# Patient Record
Sex: Female | Born: 1945 | Race: White | Hispanic: No | Smoking: Never smoker
Health system: Southern US, Community
[De-identification: ages and names within clinical notes are randomized; demographics above are authoritative.]

## PROBLEM LIST (undated history)

## (undated) DIAGNOSIS — M1990 Unspecified osteoarthritis, unspecified site: Secondary | ICD-10-CM

## (undated) DIAGNOSIS — I10 Essential (primary) hypertension: Secondary | ICD-10-CM

## (undated) DIAGNOSIS — E785 Hyperlipidemia, unspecified: Secondary | ICD-10-CM

## (undated) DIAGNOSIS — M7751 Other enthesopathy of right foot: Secondary | ICD-10-CM

## (undated) HISTORY — DX: Hyperlipidemia, unspecified: E78.5

## (undated) HISTORY — DX: Unspecified osteoarthritis, unspecified site: M19.90

## (undated) HISTORY — DX: Essential (primary) hypertension: I10

## (undated) HISTORY — DX: Other enthesopathy of right foot and ankle: M77.51

---

## 2006-08-03 ENCOUNTER — Ambulatory Visit: Payer: Self-pay | Admitting: Cardiovascular Disease

## 2006-08-03 LAB — CONVERTED CEMR LAB
Basophils Absolute: 0.1 10*3/uL (ref 0.0–0.1)
Creatinine, Ser: 0.9 mg/dL (ref 0.4–1.2)
Eosinophils Absolute: 0 10*3/uL (ref 0.0–0.6)
GFR calc non Af Amer: 67 mL/min
HCT: 46.1 % — ABNORMAL HIGH (ref 36.0–46.0)
Hemoglobin: 15.9 g/dL — ABNORMAL HIGH (ref 12.0–15.0)
MCHC: 34.4 g/dL (ref 30.0–36.0)
MCV: 86.8 fL (ref 78.0–100.0)
Monocytes Absolute: 0.5 10*3/uL (ref 0.2–0.7)
Neutrophils Relative %: 69.2 % (ref 43.0–77.0)
Potassium: 3.4 meq/L — ABNORMAL LOW (ref 3.5–5.1)
Sed Rate: 6 mm/hr (ref 0–25)
Sodium: 135 meq/L (ref 135–145)

## 2006-08-17 ENCOUNTER — Ambulatory Visit: Payer: Self-pay

## 2006-08-17 ENCOUNTER — Ambulatory Visit: Payer: Self-pay | Admitting: Cardiovascular Disease

## 2006-08-17 LAB — CONVERTED CEMR LAB
BUN: 17 mg/dL (ref 6–23)
CO2: 31 meq/L (ref 19–32)
Creatinine, Ser: 0.6 mg/dL (ref 0.4–1.2)
Ferritin: 80.2 ng/mL (ref 10.0–291.0)
Potassium: 3.8 meq/L (ref 3.5–5.1)

## 2011-08-18 DIAGNOSIS — E785 Hyperlipidemia, unspecified: Secondary | ICD-10-CM | POA: Diagnosis not present

## 2011-08-18 DIAGNOSIS — I10 Essential (primary) hypertension: Secondary | ICD-10-CM | POA: Diagnosis not present

## 2011-08-18 DIAGNOSIS — E559 Vitamin D deficiency, unspecified: Secondary | ICD-10-CM | POA: Diagnosis not present

## 2012-04-20 DIAGNOSIS — E785 Hyperlipidemia, unspecified: Secondary | ICD-10-CM | POA: Diagnosis not present

## 2012-04-20 DIAGNOSIS — I10 Essential (primary) hypertension: Secondary | ICD-10-CM | POA: Diagnosis not present

## 2012-10-08 ENCOUNTER — Other Ambulatory Visit: Payer: Self-pay | Admitting: Nurse Practitioner

## 2012-11-12 ENCOUNTER — Ambulatory Visit (INDEPENDENT_AMBULATORY_CARE_PROVIDER_SITE_OTHER): Payer: Medicare Other | Admitting: Nurse Practitioner

## 2012-11-12 VITALS — BP 113/66 | HR 73 | Temp 98.2°F | Ht 65.0 in | Wt 155.0 lb

## 2012-11-12 DIAGNOSIS — E785 Hyperlipidemia, unspecified: Secondary | ICD-10-CM | POA: Insufficient documentation

## 2012-11-12 DIAGNOSIS — I10 Essential (primary) hypertension: Secondary | ICD-10-CM | POA: Diagnosis not present

## 2012-11-12 MED ORDER — LISINOPRIL-HYDROCHLOROTHIAZIDE 20-12.5 MG PO TABS: 1.0000 | ORAL_TABLET | Freq: Every day | ORAL | Status: DC

## 2012-11-12 MED ORDER — ROSUVASTATIN CALCIUM 20 MG PO TABS: 20.0000 mg | ORAL_TABLET | Freq: Every day | ORAL | Status: DC

## 2012-11-12 NOTE — Progress Notes (Signed)
  Subjective:    Patient ID: Rebecca Wolfe, female    DOB: 05-26-1946, 67 y.o.   MRN: [id number]  Hyperlipidemia This is a chronic problem. The current episode started more than 1 year ago. The problem is uncontrolled. Recent lipid tests were reviewed and are high. Pertinent negatives include no chest pain or shortness of breath. Current antihyperlipidemic treatment includes statins. The current treatment provides significant improvement of lipids. Compliance problems include adherence to diet.  Risk factors for coronary artery disease include hypertension and post-menopausal.  Hypertension This is a chronic problem. The current episode started more than 1 year ago. The problem has been resolved since onset. The problem is controlled. Pertinent negatives include no blurred vision, chest pain, headaches, neck pain, orthopnea, palpitations, shortness of breath or sweats. There are no associated agents to hypertension. Risk factors for coronary artery disease include dyslipidemia, post-menopausal state and sedentary lifestyle. Past treatments include ACE inhibitors and diuretics. The current treatment provides significant improvement. There are no compliance problems.       Review of Systems  HENT: Negative for neck pain.   Eyes: Negative for blurred vision.  Respiratory: Negative for shortness of breath.   Cardiovascular: Negative for chest pain, palpitations and orthopnea.  Neurological: Negative for headaches.  All other systems reviewed and are negative.       Objective:   Physical Exam  Constitutional: Rebecca Wolfe is oriented to person, place, and time. Rebecca Wolfe appears well-developed and well-nourished.  HENT:  Nose: Nose normal.  Mouth/Throat: Oropharynx is clear and moist.  Eyes: EOM are normal.  Neck: Trachea normal, normal range of motion and full passive range of motion without pain. Neck supple. No JVD present. Carotid bruit is not present. No thyromegaly present.  Cardiovascular: Normal  rate, regular rhythm, normal heart sounds and intact distal pulses.  Exam reveals no gallop and no friction rub.   No murmur heard. Pulmonary/Chest: Effort normal and breath sounds normal.  Abdominal: Soft. Bowel sounds are normal. Rebecca Wolfe exhibits no distension and no mass. There is no tenderness.  Musculoskeletal: Normal range of motion.  Lymphadenopathy:    Rebecca Wolfe has no cervical adenopathy.  Neurological: Rebecca Wolfe is alert and oriented to person, place, and time. Rebecca Wolfe has normal reflexes.  Skin: Skin is warm and dry.  Psychiatric: Rebecca Wolfe has a normal mood and affect. Her behavior is normal. Judgment and thought content normal.   BP 113/66  Pulse 73  Temp(Src) 98.2 F (36.8 C) (Oral)  Ht 5\' 5"  (1.651 m)  Wt 155 lb (70.308 kg)  BMI 25.79 kg/m2        Assessment & Plan:  1. Hypertension Low na+ diet and exercise - lisinopril-hydrochlorothiazide (PRINZIDE,ZESTORETIC) 20-12.5 MG per tablet; Take 1 tablet by mouth daily.  Dispense: 30 tablet; Refill: 5 - COMPLETE METABOLIC PANEL WITH GFR  2. Hyperlipidemia Low fat diet an dexercise - rosuvastatin (CRESTOR) 20 MG tablet; Take 1 tablet (20 mg total) by mouth daily.  Dispense: 30 tablet; Refill: 5 - NMR Lipoprofile with Lipids  Mary-Margaret Daphine Deutscher, FNP

## 2012-11-12 NOTE — Patient Instructions (Signed)

## 2012-11-13 LAB — NMR LIPOPROFILE WITH LIPIDS
HDL Particle Number: 42.7 umol/L (ref >=30.5)
LDL Size: 20.3 nm — ABNORMAL LOW (ref >20.5)
Large HDL-P: 3 umol/L — ABNORMAL LOW (ref >=4.8)
Large VLDL-P: 3.3 nmol/L — ABNORMAL HIGH (ref <=2.7)
Small LDL Particle Number: 1193 nmol/L — ABNORMAL HIGH (ref <=527)

## 2012-11-13 LAB — COMPLETE METABOLIC PANEL WITH GFR
ALT: 25 U/L (ref 0–35)
Albumin: 4.7 g/dL (ref 3.5–5.2)
CO2: 26 mEq/L (ref 19–32)
Calcium: 10 mg/dL (ref 8.4–10.5)
Chloride: 99 mEq/L (ref 96–112)
Creat: 0.84 mg/dL (ref 0.50–1.10)
GFR, Est African American: 82 mL/min
Potassium: 4.7 mEq/L (ref 3.5–5.3)
Sodium: 136 mEq/L (ref 135–145)
Total Protein: 7.5 g/dL (ref 6.0–8.3)

## 2012-11-14 ENCOUNTER — Telehealth: Payer: Self-pay | Admitting: Nurse Practitioner

## 2012-11-14 DIAGNOSIS — I10 Essential (primary) hypertension: Secondary | ICD-10-CM

## 2012-11-14 MED ORDER — LISINOPRIL-HYDROCHLOROTHIAZIDE 20-12.5 MG PO TABS: 2.0000 | ORAL_TABLET | Freq: Every day | ORAL | Status: DC

## 2012-11-14 NOTE — Telephone Encounter (Signed)
WENT OVER LABS

## 2012-11-14 NOTE — Telephone Encounter (Signed)
rx corrected and sent to pharmacy

## 2012-11-14 NOTE — Telephone Encounter (Signed)
Pt aware that new rx was sent pharmacy

## 2012-11-14 NOTE — Telephone Encounter (Signed)
Please call pharmacy and find out exactly how has taken in the past

## 2012-11-14 NOTE — Telephone Encounter (Signed)
She has been on 2 a day per [place] at [facility]

## 2012-11-14 NOTE — Progress Notes (Signed)
PT AWARE OF LABS 

## 2012-12-05 ENCOUNTER — Encounter: Payer: Self-pay | Admitting: Pharmacist

## 2012-12-05 ENCOUNTER — Ambulatory Visit (INDEPENDENT_AMBULATORY_CARE_PROVIDER_SITE_OTHER): Payer: Medicare Other | Admitting: Pharmacist

## 2012-12-05 VITALS — BP 113/70 | HR 72 | Ht 65.0 in | Wt 150.0 lb

## 2012-12-05 DIAGNOSIS — E785 Hyperlipidemia, unspecified: Secondary | ICD-10-CM

## 2012-12-05 MED ORDER — EZETIMIBE-SIMVASTATIN 10-40 MG PO TABS: 1.0000 | ORAL_TABLET | Freq: Every day | ORAL | Status: DC

## 2012-12-05 NOTE — Progress Notes (Signed)
Lipid Clinic Consultation  Chief Complaint:   Chief Complaint  Patient presents with  . Hyperlipidemia     HPI:  First visit to clinical pharmacist for evaluation of hyperlipidemia.  She has been taking Crestor 20mg  daily but most recent LDL-P was still elevated.  Has taken lipitor, niaspan and zocor in past but she is not sure why stopped - possibly myalgias/flushing.  Family history significant for stroke - father at 69yo and paternal GF in 73's  Assessment: CHD/CHF Risk Equivalents:  none NCEP Risk Factors Present:  HTN, family history and age Primary Problem(s):  LDL or LDL-P elevated and TG elevated  Current NCEP Goals: LDL Goal < 100 HDL Goal >/= 45 Tg Goal < 147 Non-HDL Goal < 130  Secondary cause of hyperlipidemia present:  None  Low fat diet followed?  Yes - trying to limit fat intake and breads, pasta and rice.  No high sugar food Low carb diet followed?  Yes - trying to limit fat intake and breads, pasta and rice.  No high sugar food Exercise?  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        Component Value Date/Time   TRIG 159* 11/12/2012 1512   LDL-P = 1893 LDL-C = 119  Assessment:  Dyslipidemia  Recommendations: Changes in lipid medication(s):  Discontinue crestor and try vytorin 10/40mg  1 tablet daily in the evening  Recheck Lipid Panel:  6 weeks Other labs needed:  LFT's and direct LDL  Time spent counseling patient:  30 minutes    PharmD:  Henrene Pastor, PHARMD

## 2013-01-16 ENCOUNTER — Other Ambulatory Visit (INDEPENDENT_AMBULATORY_CARE_PROVIDER_SITE_OTHER): Payer: Medicare Other

## 2013-01-16 DIAGNOSIS — E785 Hyperlipidemia, unspecified: Secondary | ICD-10-CM

## 2013-01-16 LAB — LIPID PANEL
Cholesterol: 114 mg/dL (ref 0–200)
HDL: 42 mg/dL (ref >39)
Total CHOL/HDL Ratio: 2.7 Ratio
Triglycerides: 110 mg/dL (ref <150)
VLDL: 22 mg/dL (ref 0–40)

## 2013-01-16 LAB — HEPATIC FUNCTION PANEL
AST: 21 U/L (ref 0–37)
Bilirubin, Direct: 0.1 mg/dL (ref 0.0–0.3)
Total Bilirubin: 0.5 mg/dL (ref 0.3–1.2)

## 2013-01-16 NOTE — Progress Notes (Signed)
Pt came in for labs only 

## 2013-01-17 ENCOUNTER — Telehealth: Payer: Self-pay | Admitting: Pharmacist

## 2013-01-17 NOTE — Telephone Encounter (Signed)
Patient is tolerating Vytorin well.  LFTs were WNL.  Continue Vytorin at current dose.

## 2013-01-24 ENCOUNTER — Encounter: Payer: Self-pay | Admitting: Family Medicine

## 2013-04-11 ENCOUNTER — Other Ambulatory Visit: Payer: Self-pay

## 2013-04-24 ENCOUNTER — Ambulatory Visit (INDEPENDENT_AMBULATORY_CARE_PROVIDER_SITE_OTHER): Payer: Medicare Other | Admitting: Nurse Practitioner

## 2013-04-24 ENCOUNTER — Encounter: Payer: Self-pay | Admitting: Nurse Practitioner

## 2013-04-24 VITALS — BP 117/58 | HR 68 | Temp 99.7°F | Ht 66.0 in | Wt 149.0 lb

## 2013-04-24 DIAGNOSIS — E785 Hyperlipidemia, unspecified: Secondary | ICD-10-CM | POA: Diagnosis not present

## 2013-04-24 DIAGNOSIS — I10 Essential (primary) hypertension: Secondary | ICD-10-CM | POA: Diagnosis not present

## 2013-04-24 DIAGNOSIS — Z23 Encounter for immunization: Secondary | ICD-10-CM

## 2013-04-24 MED ORDER — LISINOPRIL-HYDROCHLOROTHIAZIDE 20-12.5 MG PO TABS: 2.0000 | ORAL_TABLET | Freq: Every day | ORAL | Status: DC

## 2013-04-24 MED ORDER — EZETIMIBE-SIMVASTATIN 10-40 MG PO TABS: 1.0000 | ORAL_TABLET | Freq: Every day | ORAL | Status: DC

## 2013-04-24 NOTE — Patient Instructions (Signed)
Hypertriglyceridemia  Diet for High blood levels of Triglycerides Most fats in food are triglycerides. Triglycerides in your blood are stored as fat in your body. High levels of triglycerides in your blood may put you at a greater risk for heart disease and stroke.  Normal triglyceride levels are less than 150 mg/dL. Borderline high levels are 150-199 mg/dl. High levels are 200 - 499 mg/dL, and very high triglyceride levels are greater than 500 mg/dL. The decision to treat high triglycerides is generally based on the level. For people with borderline or high triglyceride levels, treatment includes weight loss and exercise. Drugs are recommended for people with very high triglyceride levels. Many people who need treatment for high triglyceride levels have metabolic syndrome. This syndrome is a collection of disorders that often include: insulin resistance, high blood pressure, blood clotting problems, high cholesterol and triglycerides. TESTING PROCEDURE FOR TRIGLYCERIDES  You should not eat 4 hours before getting your triglycerides measured. The normal range of triglycerides is between 10 and 250 milligrams per deciliter (mg/dl). Some people may have extreme levels (1000 or above), but your triglyceride level may be too high if it is above 150 mg/dl, depending on what other risk factors you have for heart disease.  People with high blood triglycerides may also have high blood cholesterol levels. If you have high blood cholesterol as well as high blood triglycerides, your risk for heart disease is probably greater than if you only had high triglycerides. High blood cholesterol is one of the main risk factors for heart disease. CHANGING YOUR DIET  Your weight can affect your blood triglyceride level. If you are more than 20% above your ideal body weight, you may be able to lower your blood triglycerides by losing weight. Eating less and exercising regularly is the best way to combat this. Fat provides more  calories than any other food. The best way to lose weight is to eat less fat. Only 30% of your total calories should come from fat. Less than 7% of your diet should come from saturated fat. A diet low in fat and saturated fat is the same as a diet to decrease blood cholesterol. By eating a diet lower in fat, you may lose weight, lower your blood cholesterol, and lower your blood triglyceride level.  Eating a diet low in fat, especially saturated fat, may also help you lower your blood triglyceride level. Ask your dietitian to help you figure how much fat you can eat based on the number of calories your caregiver has prescribed for you.  Exercise, in addition to helping with weight loss may also help lower triglyceride levels.   Alcohol can increase blood triglycerides. You may need to stop drinking alcoholic beverages.  Too much carbohydrate in your diet may also increase your blood triglycerides. Some complex carbohydrates are necessary in your diet. These may include bread, rice, potatoes, other starchy vegetables and cereals.  Reduce "simple" carbohydrates. These may include pure sugars, candy, honey, and jelly without losing other nutrients. If you have the kind of high blood triglycerides that is affected by the amount of carbohydrates in your diet, you will need to eat less sugar and less high-sugar foods. Your caregiver can help you with this.  Adding 2-4 grams of fish oil (EPA+ DHA) may also help lower triglycerides. Speak with your caregiver before adding any supplements to your regimen. Following the Diet  Maintain your ideal weight. Your caregivers can help you with a diet. Generally, eating less food and getting more   exercise will help you lose weight. Joining a weight control group may also help. Ask your caregivers for a good weight control group in your area.  Eat low-fat foods instead of high-fat foods. This can help you lose weight too.  These foods are lower in fat. Eat MORE of these:    Dried beans, peas, and lentils.  Egg whites.  Low-fat cottage cheese.  Fish.  Lean cuts of meat, such as round, sirloin, rump, and flank (cut extra fat off meat you fix).  Whole grain breads, cereals and pasta.  Skim and nonfat dry milk.  Low-fat yogurt.  Poultry without the skin.  Cheese made with skim or part-skim milk, such as mozzarella, parmesan, farmers', ricotta, or pot cheese. These are higher fat foods. Eat LESS of these:   Whole milk and foods made from whole milk, such as American, blue, cheddar, monterey jack, and swiss cheese  High-fat meats, such as luncheon meats, sausages, knockwurst, bratwurst, hot dogs, ribs, corned beef, ground pork, and regular ground beef.  Fried foods. Limit saturated fats in your diet. Substituting unsaturated fat for saturated fat may decrease your blood triglyceride level. You will need to read package labels to know which products contain saturated fats.  These foods are high in saturated fat. Eat LESS of these:   Fried pork skins.  Whole milk.  Skin and fat from poultry.  Palm oil.  Butter.  Shortening.  Cream cheese.  Bacon.  Margarines and baked goods made from listed oils.  Vegetable shortenings.  Chitterlings.  Fat from meats.  Coconut oil.  Palm kernel oil.  Lard.  Cream.  Sour cream.  Fatback.  Coffee whiteners and non-dairy creamers made with these oils.  Cheese made from whole milk. Use unsaturated fats (both polyunsaturated and monounsaturated) moderately. Remember, even though unsaturated fats are better than saturated fats; you still want a diet low in total fat.  These foods are high in unsaturated fat:   Canola oil.  Sunflower oil.  Mayonnaise.  Almonds.  Peanuts.  Pine nuts.  Margarines made with these oils.  Safflower oil.  Olive oil.  Avocados.  Cashews.  Peanut butter.  Sunflower seeds.  Soybean oil.  Peanut  oil.  Olives.  Pecans.  Walnuts.  Pumpkin seeds. Avoid sugar and other high-sugar foods. This will decrease carbohydrates without decreasing other nutrients. Sugar in your food goes rapidly to your blood. When there is excess sugar in your blood, your liver may use it to make more triglycerides. Sugar also contains calories without other important nutrients.  Eat LESS of these:   Sugar, brown sugar, powdered sugar, jam, jelly, preserves, honey, syrup, molasses, pies, candy, cakes, cookies, frosting, pastries, colas, soft drinks, punches, fruit drinks, and regular gelatin.  Avoid alcohol. Alcohol, even more than sugar, may increase blood triglycerides. In addition, alcohol is high in calories and low in nutrients. Ask for sparkling water, or a diet soft drink instead of an alcoholic beverage. Suggestions for planning and preparing meals   Bake, broil, grill or roast meats instead of frying.  Remove fat from meats and skin from poultry before cooking.  Add spices, herbs, lemon juice or vinegar to vegetables instead of salt, rich sauces or gravies.  Use a non-stick skillet without fat or use no-stick sprays.  Cool and refrigerate stews and broth. Then remove the hardened fat floating on the surface before serving.  Refrigerate meat drippings and skim off fat to make low-fat gravies.  Serve more fish.  Use less butter,   margarine and other high-fat spreads on bread or vegetables.  Use skim or reconstituted non-fat dry milk for cooking.  Cook with low-fat cheeses.  Substitute low-fat yogurt or cottage cheese for all or part of the sour cream in recipes for sauces, dips or congealed salads.  Use half yogurt/half mayonnaise in salad recipes.  Substitute evaporated skim milk for cream. Evaporated skim milk or reconstituted non-fat dry milk can be whipped and substituted for whipped cream in certain recipes.  Choose fresh fruits for dessert instead of high-fat foods such as pies or  cakes. Fruits are naturally low in fat. When Dining Out   Order low-fat appetizers such as fruit or vegetable juice, pasta with vegetables or tomato sauce.  Select clear, rather than cream soups.  Ask that dressings and gravies be served on the side. Then use less of them.  Order foods that are baked, broiled, poached, steamed, stir-fried, or roasted.  Ask for margarine instead of butter, and use only a small amount.  Drink sparkling water, unsweetened tea or coffee, or diet soft drinks instead of alcohol or other sweet beverages. QUESTIONS AND ANSWERS ABOUT OTHER FATS IN THE BLOOD: SATURATED FAT, TRANS FAT, AND CHOLESTEROL What is trans fat? Trans fat is a type of fat that is formed when vegetable oil is hardened through a process called hydrogenation. This process helps makes foods more solid, gives them shape, and prolongs their shelf life. Trans fats are also called hydrogenated or partially hydrogenated oils.  What do saturated fat, trans fat, and cholesterol in foods have to do with heart disease? Saturated fat, trans fat, and cholesterol in the diet all raise the level of LDL "bad" cholesterol in the blood. The higher the LDL cholesterol, the greater the risk for coronary heart disease (CHD). Saturated fat and trans fat raise LDL similarly.  What foods contain saturated fat, trans fat, and cholesterol? High amounts of saturated fat are found in animal products, such as fatty cuts of meat, chicken skin, and full-fat dairy products like butter, whole milk, cream, and cheese, and in tropical vegetable oils such as palm, palm kernel, and coconut oil. Trans fat is found in some of the same foods as saturated fat, such as vegetable shortening, some margarines (especially hard or stick margarine), crackers, cookies, baked goods, fried foods, salad dressings, and other processed foods made with partially hydrogenated vegetable oils. Small amounts of trans fat also occur naturally in some animal  products, such as milk products, beef, and lamb. Foods high in cholesterol include liver, other organ meats, egg yolks, shrimp, and full-fat dairy products. How can I use the new food label to make heart-healthy food choices? Check the Nutrition Facts panel of the food label. Choose foods lower in saturated fat, trans fat, and cholesterol. For saturated fat and cholesterol, you can also use the Percent Daily Value (%DV): 5% DV or less is low, and 20% DV or more is high. (There is no %DV for trans fat.) Use the Nutrition Facts panel to choose foods low in saturated fat and cholesterol, and if the trans fat is not listed, read the ingredients and limit products that list shortening or hydrogenated or partially hydrogenated vegetable oil, which tend to be high in trans fat. POINTS TO REMEMBER:   Discuss your risk for heart disease with your caregivers, and take steps to reduce risk factors.  Change your diet. Choose foods that are low in saturated fat, trans fat, and cholesterol.  Add exercise to your daily routine if   it is not already being done. Participate in physical activity of moderate intensity, like brisk walking, for at least 30 minutes on most, and preferably all days of the week. No time? Break the 30 minutes into three, 10-minute segments during the day.  Stop smoking. If you do smoke, contact your caregiver to discuss ways in which they can help you quit.  Do not use street drugs.  Maintain a normal weight.  Maintain a healthy blood pressure.  Keep up with your blood work for checking the fats in your blood as directed by your caregiver. Document Released: 03/10/2004 Document Revised: 11/22/2011 Document Reviewed: 10/06/2008 [employer] Patient Information 2014 [employer].  

## 2013-04-24 NOTE — Progress Notes (Signed)
Subjective:    Patient ID: Rebecca Wolfe, female    DOB: 03-Dec-1945, 67 y.o.   MRN: [id number]  Hyperlipidemia This is a chronic problem. The current episode started more than 1 year ago. The problem is uncontrolled. Recent lipid tests were reviewed and are high. Pertinent negatives include no chest pain or shortness of breath. Current antihyperlipidemic treatment includes statins. The current treatment provides significant improvement of lipids. Compliance problems include adherence to diet.  Risk factors for coronary artery disease include hypertension and post-menopausal.  Hypertension This is a chronic problem. The current episode started more than 1 year ago. The problem has been resolved since onset. The problem is controlled. Pertinent negatives include no blurred vision, chest pain, headaches, neck pain, orthopnea, palpitations, shortness of breath or sweats. There are no associated agents to hypertension. Risk factors for coronary artery disease include dyslipidemia, post-menopausal state and sedentary lifestyle. Past treatments include ACE inhibitors and diuretics. The current treatment provides significant improvement. There are no compliance problems.       Review of Systems  Eyes: Negative for blurred vision.  Respiratory: Negative for shortness of breath.   Cardiovascular: Negative for chest pain, palpitations and orthopnea.  Musculoskeletal: Negative for neck pain.  Neurological: Negative for headaches.  All other systems reviewed and are negative.       Objective:   Physical Exam  Constitutional: She is oriented to person, place, and time. She appears well-developed and well-nourished.  HENT:  Nose: Nose normal.  Mouth/Throat: Oropharynx is clear and moist.  Eyes: EOM are normal.  Neck: Trachea normal, normal range of motion and full passive range of motion without pain. Neck supple. No JVD present. Carotid bruit is not present. No thyromegaly present.  Cardiovascular:  Normal rate, regular rhythm, normal heart sounds and intact distal pulses.  Exam reveals no gallop and no friction rub.   No murmur heard. Pulmonary/Chest: Effort normal and breath sounds normal.  Abdominal: Soft. Bowel sounds are normal. She exhibits no distension and no mass. There is no tenderness.  Musculoskeletal: Normal range of motion.  Lymphadenopathy:    She has no cervical adenopathy.  Neurological: She is alert and oriented to person, place, and time. She has normal reflexes.  Skin: Skin is warm and dry.  Psychiatric: She has a normal mood and affect. Her behavior is normal. Judgment and thought content normal.   BP 117/58  Pulse 68  Temp(Src) 99.7 F (37.6 C) (Oral)  Ht 5\' 6"  (1.676 m)  Wt 149 lb (67.586 kg)  BMI 24.06 kg/m2        Assessment & Plan:   1. Need for prophylactic vaccination and inoculation against influenza   2. Hypertension   3. Hyperlipidemia    Orders Placed This Encounter  Procedures  . CMP14+EGFR  . NMR, lipoprofile   Meds ordered this encounter  Medications  . lisinopril-hydrochlorothiazide (PRINZIDE,ZESTORETIC) 20-12.5 MG per tablet    Sig: Take 2 tablets by mouth daily.    Dispense:  60 tablet    Refill:  5    Order Specific Question:  Supervising Provider    Answer:  Ernestina Penna [[id number]]  . ezetimibe-simvastatin (VYTORIN) 10-40 MG per tablet    Sig: Take 1 tablet by mouth at bedtime.    Dispense:  49 tablet    Refill:  0    Order Specific Question:  Supervising Provider    Answer:  Deborra Medina    Continue all meds Labs pending Diet and exercise encouraged  Health maintenance reviewed Follow up in 3 months   Mary-Margaret Daphine Deutscher, FNP

## 2013-04-25 ENCOUNTER — Telehealth: Payer: Self-pay | Admitting: Nurse Practitioner

## 2013-04-25 ENCOUNTER — Other Ambulatory Visit: Payer: Self-pay | Admitting: Nurse Practitioner

## 2013-04-25 MED ORDER — ATORVASTATIN CALCIUM 40 MG PO TABS: 40.0000 mg | ORAL_TABLET | Freq: Every day | ORAL | Status: DC

## 2013-04-25 NOTE — Telephone Encounter (Signed)
Patient on vytorin and insurance doesn't want to pay for it- would she like to do zocor by itself which will not work as well or will she do lipitor which will work better.

## 2013-04-25 NOTE — Telephone Encounter (Signed)
Patient is aware and will agree to take

## 2013-04-25 NOTE — Telephone Encounter (Signed)
lipitor sent to pharmacy

## 2013-04-26 LAB — NMR, LIPOPROFILE
LDL Particle Number: 1201 nmol/L — ABNORMAL HIGH (ref <1000)
LDL Size: 20.5 nm — ABNORMAL LOW (ref >20.5)
LDLC SERPL CALC-MCNC: 52 mg/dL (ref <100)
LP-IR Score: 63 — ABNORMAL HIGH (ref <=45)

## 2013-04-26 LAB — CMP14+EGFR
AST: 18 IU/L (ref 0–40)
Alkaline Phosphatase: 52 IU/L (ref 39–117)
BUN/Creatinine Ratio: 23 (ref 11–26)
BUN: 20 mg/dL (ref 8–27)
CO2: 26 mmol/L (ref 18–29)
Chloride: 98 mmol/L (ref 97–108)
Potassium: 4.9 mmol/L (ref 3.5–5.2)
Sodium: 141 mmol/L (ref 134–144)
Total Bilirubin: 0.3 mg/dL (ref 0.0–1.2)

## 2013-07-16 ENCOUNTER — Telehealth: Payer: Self-pay | Admitting: Nurse Practitioner

## 2013-07-26 ENCOUNTER — Ambulatory Visit: Payer: Medicare Other | Admitting: Nurse Practitioner

## 2013-08-14 ENCOUNTER — Encounter: Payer: Self-pay | Admitting: Nurse Practitioner

## 2013-08-14 ENCOUNTER — Ambulatory Visit (INDEPENDENT_AMBULATORY_CARE_PROVIDER_SITE_OTHER): Payer: Medicare Other | Admitting: Nurse Practitioner

## 2013-08-14 VITALS — BP 112/65 | HR 76 | Temp 97.4°F | Ht 66.0 in | Wt 149.4 lb

## 2013-08-14 DIAGNOSIS — E785 Hyperlipidemia, unspecified: Secondary | ICD-10-CM

## 2013-08-14 DIAGNOSIS — R5383 Other fatigue: Secondary | ICD-10-CM

## 2013-08-14 DIAGNOSIS — I10 Essential (primary) hypertension: Secondary | ICD-10-CM | POA: Diagnosis not present

## 2013-08-14 DIAGNOSIS — R5381 Other malaise: Secondary | ICD-10-CM

## 2013-08-14 MED ORDER — ATORVASTATIN CALCIUM 40 MG PO TABS
40.0000 mg | ORAL_TABLET | Freq: Every day | ORAL | Status: DC
Start: 2013-08-14 — End: 2014-04-25

## 2013-08-14 MED ORDER — CITALOPRAM HYDROBROMIDE 20 MG PO TABS: 20.0000 mg | ORAL_TABLET | Freq: Every day | ORAL | Status: DC

## 2013-08-14 MED ORDER — LISINOPRIL-HYDROCHLOROTHIAZIDE 20-12.5 MG PO TABS: 2.0000 | ORAL_TABLET | Freq: Every day | ORAL | Status: DC

## 2013-08-14 NOTE — Patient Instructions (Signed)

## 2013-08-14 NOTE — Progress Notes (Signed)
Subjective:    Patient ID: Rebecca Wolfe, female    DOB: 1946/02/04, 68 y.o.   MRN: [id number]  Patient in today for follow up of chronic medical problems- SHe is doing well- Her only complaint is anxiety- SHe has been under a lot of stress- needs something to calm her down.  Hyperlipidemia This is a chronic problem. The current episode started more than 1 year ago. The problem is uncontrolled. Recent lipid tests were reviewed and are high. Pertinent negatives include no chest pain or shortness of breath. Current antihyperlipidemic treatment includes statins. The current treatment provides significant improvement of lipids. Compliance problems include adherence to diet.  Risk factors for coronary artery disease include hypertension and post-menopausal.  Hypertension This is a chronic problem. The current episode started more than 1 year ago. The problem has been resolved since onset. The problem is controlled. Pertinent negatives include no blurred vision, chest pain, headaches, neck pain, orthopnea, palpitations, shortness of breath or sweats. There are no associated agents to hypertension. Risk factors for coronary artery disease include dyslipidemia, post-menopausal state and sedentary lifestyle. Past treatments include ACE inhibitors and diuretics. The current treatment provides significant improvement. There are no compliance problems.       Review of Systems  Eyes: Negative for blurred vision.  Respiratory: Negative for shortness of breath.   Cardiovascular: Negative for chest pain, palpitations and orthopnea.  Musculoskeletal: Negative for neck pain.  Neurological: Negative for headaches.  All other systems reviewed and are negative.       Objective:   Physical Exam  Constitutional: She is oriented to person, place, and time. She appears well-developed and well-nourished.  HENT:  Nose: Nose normal.  Mouth/Throat: Oropharynx is clear and moist.  Eyes: EOM are normal.  Neck:  Trachea normal, normal range of motion and full passive range of motion without pain. Neck supple. No JVD present. Carotid bruit is not present. No thyromegaly present.  Cardiovascular: Normal rate, regular rhythm, normal heart sounds and intact distal pulses.  Exam reveals no gallop and no friction rub.   No murmur heard. Pulmonary/Chest: Effort normal and breath sounds normal.  Abdominal: Soft. Bowel sounds are normal. She exhibits no distension and no mass. There is no tenderness.  Musculoskeletal: Normal range of motion.  Lymphadenopathy:    She has no cervical adenopathy.  Neurological: She is alert and oriented to person, place, and time. She has normal reflexes.  Skin: Skin is warm and dry.  Psychiatric: She has a normal mood and affect. Her behavior is normal. Judgment and thought content normal.   BP 112/65  Pulse 76  Temp(Src) 97.4 F (36.3 C) (Oral)  Ht 5' 6" (1.676 m)  Wt 149 lb 6.4 oz (67.767 kg)  BMI 24.13 kg/m2        Assessment & Plan:   1. Hypertension   2. Hyperlipidemia   3. Other malaise and fatigue    Orders Placed This Encounter  Procedures  . CMP14+EGFR  . NMR, lipoprofile  . Thyroid Panel With TSH   Meds ordered this encounter  Medications  . atorvastatin (LIPITOR) 40 MG tablet    Sig: Take 1 tablet (40 mg total) by mouth daily.    Dispense:  90 tablet    Refill:  1    Order Specific Question:  Supervising Provider    Answer:  Chipper Herb [[id number]]  . lisinopril-hydrochlorothiazide (PRINZIDE,ZESTORETIC) 20-12.5 MG per tablet    Sig: Take 2 tablets by mouth daily.    Dispense:  90 tablet    Refill:  1    Order Specific Question:  Supervising Provider    Answer:  Chipper Herb [[id number]]  . citalopram (CELEXA) 20 MG tablet    Sig: Take 1 tablet (20 mg total) by mouth daily.    Dispense:  30 tablet    Refill:  3    Order Specific Question:  Supervising Provider    Answer:  Chipper Herb [[id number]]    Labs pending Health maintenance  reviewed Diet and exercise encouraged Continue all meds Follow up  In 3 months    [employer], FNP

## 2013-08-16 LAB — THYROID PANEL WITH TSH
Free Thyroxine Index: 2.3 (ref 1.2–4.9)
T3 Uptake Ratio: 23 % — ABNORMAL LOW (ref 24–39)
T4, Total: 10.1 ug/dL (ref 4.5–12.0)
TSH: 1.51 u[IU]/mL (ref 0.450–4.500)

## 2013-08-16 LAB — NMR, LIPOPROFILE
Cholesterol: 162 mg/dL (ref <200)
HDL CHOLESTEROL BY NMR: 57 mg/dL (ref >=40)
HDL Particle Number: 42.2 umol/L (ref >=30.5)
LDL Particle Number: 1257 nmol/L — ABNORMAL HIGH (ref <1000)
LDL Size: 20.3 nm — ABNORMAL LOW (ref >20.5)
LDLC SERPL CALC-MCNC: 81 mg/dL (ref <100)
LP-IR Score: 57 — ABNORMAL HIGH (ref <=45)
SMALL LDL PARTICLE NUMBER: 773 nmol/L — AB (ref <=527)
Triglycerides by NMR: 118 mg/dL (ref <150)

## 2013-08-16 LAB — CMP14+EGFR
ALBUMIN: 5 g/dL — AB (ref 3.6–4.8)
ALK PHOS: 56 IU/L (ref 39–117)
ALT: 29 IU/L (ref 0–32)
AST: 22 IU/L (ref 0–40)
Albumin/Globulin Ratio: 2.3 (ref 1.1–2.5)
BILIRUBIN TOTAL: 0.4 mg/dL (ref 0.0–1.2)
BUN / CREAT RATIO: 18 (ref 11–26)
BUN: 17 mg/dL (ref 8–27)
CHLORIDE: 98 mmol/L (ref 97–108)
CO2: 21 mmol/L (ref 18–29)
Calcium: 9.8 mg/dL (ref 8.7–10.3)
Creatinine, Ser: 0.93 mg/dL (ref 0.57–1.00)
GFR calc non Af Amer: 63 mL/min/{1.73_m2} (ref >59)
GFR, EST AFRICAN AMERICAN: 73 mL/min/{1.73_m2} (ref >59)
Globulin, Total: 2.2 g/dL (ref 1.5–4.5)
Glucose: 85 mg/dL (ref 65–99)
Potassium: 4.8 mmol/L (ref 3.5–5.2)
SODIUM: 137 mmol/L (ref 134–144)
Total Protein: 7.2 g/dL (ref 6.0–8.5)

## 2013-12-02 ENCOUNTER — Other Ambulatory Visit: Payer: Self-pay | Admitting: *Deleted

## 2013-12-02 MED ORDER — CITALOPRAM HYDROBROMIDE 20 MG PO TABS: 20.0000 mg | ORAL_TABLET | Freq: Every day | ORAL | Status: DC

## 2014-02-24 ENCOUNTER — Other Ambulatory Visit: Payer: Self-pay | Admitting: Nurse Practitioner

## 2014-02-25 NOTE — Telephone Encounter (Signed)
no more refills without being seen  

## 2014-02-25 NOTE — Telephone Encounter (Signed)
Last ov 3/15

## 2014-04-25 ENCOUNTER — Other Ambulatory Visit: Payer: Self-pay | Admitting: *Deleted

## 2014-04-25 MED ORDER — LISINOPRIL-HYDROCHLOROTHIAZIDE 20-12.5 MG PO TABS: 2.0000 | ORAL_TABLET | Freq: Every day | ORAL | Status: DC

## 2014-04-25 MED ORDER — ATORVASTATIN CALCIUM 40 MG PO TABS: 40.0000 mg | ORAL_TABLET | Freq: Every day | ORAL | Status: DC

## 2014-04-25 NOTE — Telephone Encounter (Signed)
Needs refill on lipitor and bp med until she sees mmm on 06/16/2014

## 2014-06-16 ENCOUNTER — Ambulatory Visit: Payer: Medicare Other | Admitting: Nurse Practitioner

## 2014-06-30 ENCOUNTER — Ambulatory Visit (INDEPENDENT_AMBULATORY_CARE_PROVIDER_SITE_OTHER): Payer: Medicare Other | Admitting: Nurse Practitioner

## 2014-06-30 ENCOUNTER — Encounter: Payer: Self-pay | Admitting: Nurse Practitioner

## 2014-06-30 VITALS — BP 136/80 | HR 76 | Temp 96.8°F | Ht 66.0 in | Wt 157.0 lb

## 2014-06-30 DIAGNOSIS — F329 Major depressive disorder, single episode, unspecified: Secondary | ICD-10-CM | POA: Diagnosis not present

## 2014-06-30 DIAGNOSIS — I10 Essential (primary) hypertension: Secondary | ICD-10-CM

## 2014-06-30 DIAGNOSIS — Z23 Encounter for immunization: Secondary | ICD-10-CM

## 2014-06-30 DIAGNOSIS — E785 Hyperlipidemia, unspecified: Secondary | ICD-10-CM

## 2014-06-30 DIAGNOSIS — F32A Depression, unspecified: Secondary | ICD-10-CM | POA: Insufficient documentation

## 2014-06-30 MED ORDER — CITALOPRAM HYDROBROMIDE 20 MG PO TABS: 20.0000 mg | ORAL_TABLET | Freq: Every day | ORAL | Status: DC

## 2014-06-30 MED ORDER — LISINOPRIL-HYDROCHLOROTHIAZIDE 20-12.5 MG PO TABS: 2.0000 | ORAL_TABLET | Freq: Every day | ORAL | Status: DC

## 2014-06-30 MED ORDER — ATORVASTATIN CALCIUM 40 MG PO TABS: 40.0000 mg | ORAL_TABLET | Freq: Every day | ORAL | Status: DC

## 2014-06-30 NOTE — Progress Notes (Signed)
  Subjective:    Patient ID: Rebecca Wolfe, female    DOB: September 04, 1945, 69 y.o.   MRN: [id number]  Patient in today for follow up of chronic medical problems- SHe is doing well-  Hyperlipidemia This is a chronic problem. The current episode started more than 1 year ago. The problem is controlled. Recent lipid tests were reviewed and are variable. Factors aggravating her hyperlipidemia include thiazides. Pertinent negatives include no chest pain or shortness of breath. Current antihyperlipidemic treatment includes statins. The current treatment provides moderate improvement of lipids. Compliance problems include adherence to diet and adherence to exercise.  Risk factors for coronary artery disease include dyslipidemia, hypertension and post-menopausal.  Hypertension This is a chronic problem. The current episode started more than 1 year ago. The problem is unchanged. The problem is uncontrolled. Pertinent negatives include no chest pain, headaches, neck pain, palpitations or shortness of breath. Risk factors for coronary artery disease include dyslipidemia, post-menopausal state and sedentary lifestyle. Past treatments include ACE inhibitors and diuretics. The current treatment provides moderate improvement. Compliance problems include diet and exercise.   depression/GAD clexa working well- feels much better then she did at last visit- no c/o side effects.   Review of Systems  Respiratory: Negative for shortness of breath.   Cardiovascular: Negative for chest pain and palpitations.  Musculoskeletal: Negative for neck pain.  Neurological: Negative for headaches.  All other systems reviewed and are negative.      Objective:   Physical Exam  Constitutional: She is oriented to person, place, and time. She appears well-developed and well-nourished.  HENT:  Nose: Nose normal.  Mouth/Throat: Oropharynx is clear and moist.  Eyes: EOM are normal.  Neck: Trachea normal, normal range of motion and  full passive range of motion without pain. Neck supple. No JVD present. Carotid bruit is not present. No thyromegaly present.  Cardiovascular: Normal rate, regular rhythm, normal heart sounds and intact distal pulses.  Exam reveals no gallop and no friction rub.   No murmur heard. Pulmonary/Chest: Effort normal and breath sounds normal.  Abdominal: Soft. Bowel sounds are normal. She exhibits no distension and no mass. There is no tenderness.  Musculoskeletal: Normal range of motion.  Lymphadenopathy:    She has no cervical adenopathy.  Neurological: She is alert and oriented to person, place, and time. She has normal reflexes.  Skin: Skin is warm and dry.  Psychiatric: She has a normal mood and affect. Her behavior is normal. Judgment and thought content normal.    BP 136/80 mmHg  Pulse 76  Temp(Src) 96.8 F (36 C) (Oral)  Ht $R'5\' 6"'LS$  (1.676 m)  Wt 157 lb (71.215 kg)  BMI 25.35 kg/m2        Assessment & Plan:   1. Essential hypertension Do not add slat to diet - CMP14+EGFR - lisinopril-hydrochlorothiazide (PRINZIDE,ZESTORETIC) 20-12.5 MG per tablet; Take 2 tablets by mouth daily.  Dispense: 180 tablet; Refill: 1  2. Hyperlipidemia Low fat diet - NMR, lipoprofile - atorvastatin (LIPITOR) 40 MG tablet; Take 1 tablet (40 mg total) by mouth daily.  Dispense: 90 tablet; Refill: 1  3. Depression Stress anagement - citalopram (CELEXA) 20 MG tablet; Take 1 tablet (20 mg total) by mouth daily.  Dispense: 90 tablet; Refill: 1   Flu shot today Labs pending Health maintenance reviewed Diet and exercise encouraged Continue all meds Follow up  In 3 months   [place], FNP

## 2014-06-30 NOTE — Patient Instructions (Signed)

## 2014-07-01 LAB — NMR, LIPOPROFILE
CHOLESTEROL: 141 mg/dL (ref 100–199)
HDL Cholesterol by NMR: 56 mg/dL (ref >39)
HDL PARTICLE NUMBER: 45.5 umol/L (ref >=30.5)
LDL Particle Number: 947 nmol/L (ref <1000)
LDL SIZE: 20.1 nm (ref >20.5)
LDL-C: 68 mg/dL (ref 0–99)
LP-IR Score: 51 — ABNORMAL HIGH (ref <=45)
Small LDL Particle Number: 587 nmol/L — ABNORMAL HIGH (ref <=527)
Triglycerides by NMR: 87 mg/dL (ref 0–149)

## 2014-07-01 LAB — CMP14+EGFR
A/G RATIO: 2 (ref 1.1–2.5)
ALBUMIN: 4.7 g/dL (ref 3.5–4.8)
ALT: 19 IU/L (ref 0–32)
AST: 17 IU/L (ref 0–40)
Alkaline Phosphatase: 69 IU/L (ref 39–117)
BILIRUBIN TOTAL: 0.5 mg/dL (ref 0.0–1.2)
BUN / CREAT RATIO: 18 (ref 11–26)
BUN: 17 mg/dL (ref 8–27)
CHLORIDE: 96 mmol/L — AB (ref 97–108)
CO2: 21 mmol/L (ref 18–29)
Calcium: 9.8 mg/dL (ref 8.7–10.3)
Creatinine, Ser: 0.93 mg/dL (ref 0.57–1.00)
GFR calc Af Amer: 72 mL/min/{1.73_m2} (ref >59)
GFR calc non Af Amer: 62 mL/min/{1.73_m2} (ref >59)
GLUCOSE: 87 mg/dL (ref 65–99)
Globulin, Total: 2.4 g/dL (ref 1.5–4.5)
Potassium: 4.4 mmol/L (ref 3.5–5.2)
Sodium: 141 mmol/L (ref 134–144)
Total Protein: 7.1 g/dL (ref 6.0–8.5)

## 2014-11-12 ENCOUNTER — Other Ambulatory Visit: Payer: Self-pay | Admitting: Nurse Practitioner

## 2014-11-14 ENCOUNTER — Other Ambulatory Visit: Payer: Self-pay | Admitting: Nurse Practitioner

## 2014-12-01 ENCOUNTER — Other Ambulatory Visit: Payer: Self-pay

## 2015-01-27 ENCOUNTER — Other Ambulatory Visit: Payer: Self-pay | Admitting: Nurse Practitioner

## 2015-01-27 NOTE — Telephone Encounter (Signed)
Last seen 06/30/14  MMM 

## 2015-01-27 NOTE — Telephone Encounter (Signed)
no more refills without being seen  

## 2015-03-23 ENCOUNTER — Telehealth: Payer: Self-pay | Admitting: Nurse Practitioner

## 2015-03-23 NOTE — Telephone Encounter (Signed)
Pt given appt with MMM 10/25 at 8:30.

## 2015-03-31 ENCOUNTER — Encounter: Payer: Self-pay | Admitting: Nurse Practitioner

## 2015-03-31 ENCOUNTER — Ambulatory Visit (INDEPENDENT_AMBULATORY_CARE_PROVIDER_SITE_OTHER): Payer: Medicare Other | Admitting: Nurse Practitioner

## 2015-03-31 VITALS — BP 146/77 | HR 74 | Temp 97.3°F | Ht 66.0 in | Wt 155.0 lb

## 2015-03-31 DIAGNOSIS — E785 Hyperlipidemia, unspecified: Secondary | ICD-10-CM

## 2015-03-31 DIAGNOSIS — F329 Major depressive disorder, single episode, unspecified: Secondary | ICD-10-CM | POA: Diagnosis not present

## 2015-03-31 DIAGNOSIS — F32A Depression, unspecified: Secondary | ICD-10-CM

## 2015-03-31 DIAGNOSIS — I10 Essential (primary) hypertension: Secondary | ICD-10-CM | POA: Diagnosis not present

## 2015-03-31 DIAGNOSIS — Z23 Encounter for immunization: Secondary | ICD-10-CM

## 2015-03-31 MED ORDER — ATORVASTATIN CALCIUM 40 MG PO TABS: ORAL_TABLET | ORAL | Status: DC

## 2015-03-31 NOTE — Progress Notes (Signed)
  Subjective:    Patient ID: Rebecca Wolfe, female    DOB: [birth year], 69 y.o.   MRN: [id number]  Patient in today for follow up of chronic medical problems- She is doing well.   Hyperlipidemia This is a chronic problem. The current episode started more than 1 year ago. The problem is controlled. Recent lipid tests were reviewed and are variable. Factors aggravating her hyperlipidemia include thiazides. Pertinent negatives include no chest pain or shortness of breath. Current antihyperlipidemic treatment includes statins. The current treatment provides moderate improvement of lipids. Compliance problems include adherence to diet and adherence to exercise.  Risk factors for coronary artery disease include dyslipidemia, hypertension and post-menopausal.  Hypertension This is a chronic problem. The current episode started more than 1 year ago. The problem is unchanged. The problem is uncontrolled. Pertinent negatives include no chest pain, headaches, neck pain, palpitations or shortness of breath. Risk factors for coronary artery disease include dyslipidemia, post-menopausal state and sedentary lifestyle. Past treatments include ACE inhibitors and diuretics. The current treatment provides moderate improvement. Compliance problems include diet and exercise.   depression/GAD Celexa working well- feels much better then she did at last visit- no c/o side effects.   Review of Systems  Respiratory: Negative for shortness of breath.   Cardiovascular: Negative for chest pain and palpitations.  Musculoskeletal: Negative for neck pain.  Neurological: Negative for headaches.  All other systems reviewed and are negative.      Objective:   Physical Exam  Constitutional: She is oriented to person, place, and time. She appears well-developed and well-nourished.  HENT:  Nose: Nose normal.  Mouth/Throat: Oropharynx is clear and moist.  Eyes: EOM are normal.  Neck: Trachea normal, normal range of motion and  full passive range of motion without pain. Neck supple. No JVD present. Carotid bruit is not present. No thyromegaly present.  Cardiovascular: Normal rate, regular rhythm, normal heart sounds and intact distal pulses.  Exam reveals no gallop and no friction rub.   No murmur heard. Pulmonary/Chest: Effort normal and breath sounds normal.  Abdominal: Soft. Bowel sounds are normal. She exhibits no distension and no mass. There is no tenderness.  Musculoskeletal: Normal range of motion.  Lymphadenopathy:    She has no cervical adenopathy.  Neurological: She is alert and oriented to person, place, and time. She has normal reflexes.  Skin: Skin is warm and dry.  Psychiatric: She has a normal mood and affect. Her behavior is normal. Judgment and thought content normal.    BP 146/77 mmHg  Pulse 74  Temp(Src) 97.3 F (36.3 C) (Oral)  Ht _0  (1.676 m)  Wt 155 lb (70.308 kg)  BMI 25.03 kg/m2        Assessment & Plan:  1. Encounter for immunization Flu shot  2. Essential hypertension No added salt to diet - CMP14+EGFR  3. Hyperlipidemia Low fat diet and exercsise - atorvastatin (LIPITOR) 40 MG tablet; TAKE 1 TABLET (40 MG TOTAL) BY MOUTH DAILY.  Dispense: 90 tablet; Refill: 0 - Lipid panel   *Pt wants to wait to have EKG and chest xray done at next visit  Continue all meds Labs pending Health Maintenance reviewed- patient refuses health maintenance screenings Diet and exercise encouraged RTO 6 months  Mary-Margaret Hassell Done, FNP

## 2015-03-31 NOTE — Patient Instructions (Signed)
Bone Health Bones protect organs, store calcium, and anchor muscles. Good health habits, such as eating nutritious foods and exercising regularly, are important for maintaining healthy bones. They can also help to prevent a condition that causes bones to lose density and become weak and brittle (osteoporosis). WHY IS BONE MASS IMPORTANT? Bone mass refers to the amount of bone tissue that you have. The higher your bone mass, the stronger your bones. An important step toward having healthy bones throughout life is to have strong and dense bones during childhood. A young adult who has a high bone mass is more likely to have a high bone mass later in life. Bone mass at its greatest it is called peak bone mass. A large decline in bone mass occurs in older adults. In women, it occurs about the time of menopause. During this time, it is important to practice good health habits, because if more bone is lost than what is replaced, the bones will become less healthy and more likely to break (fracture). If you find that you have a low bone mass, you may be able to prevent osteoporosis or further bone loss by changing your diet and lifestyle. HOW CAN I FIND OUT IF MY BONE MASS IS LOW? Bone mass can be measured with an X-ray test that is called a bone mineral density (BMD) test. This test is recommended for all women who are age 65 or older. It may also be recommended for men who are age 70 or older, or for people who are more likely to develop osteoporosis due to:  Having bones that break easily.  Having a long-term disease that weakens bones, such as kidney disease or rheumatoid arthritis.  Having menopause earlier than normal.  Taking medicine that weakens bones, such as steroids, thyroid hormones, or hormone treatment for breast cancer or prostate cancer.  Smoking.  Drinking three or more alcoholic drinks each day. WHAT ARE THE NUTRITIONAL RECOMMENDATIONS FOR HEALTHY BONES? To have healthy bones, you need  to get enough of the right minerals and vitamins. Most nutrition experts recommend getting these nutrients from the foods that you eat. Nutritional recommendations vary from person to person. Ask your health care provider what is healthy for you. Here are some general guidelines. Calcium Recommendations Calcium is the most important (essential) mineral for bone health. Most people can get enough calcium from their diet, but supplements may be recommended for people who are at risk for osteoporosis. Good sources of calcium include:  Dairy products, such as low-fat or nonfat milk, cheese, and yogurt.  Dark green leafy vegetables, such as bok choy and broccoli.  Calcium-fortified foods, such as orange juice, cereal, bread, soy beverages, and tofu products.  Nuts, such as almonds. Follow these recommended amounts for daily calcium intake:  Children, age 1-3: 700 mg.  Children, age 4-8: 1,000 mg.  Children, age 9-13: 1,300 mg.  Teens, age 14-18: 1,300 mg.  Adults, age 19-50: 1,000 mg.  Adults, age 51-70:  Men: 1,000 mg.  Women: 1,200 mg.  Adults, age 71 or older: 1,200 mg.  Pregnant and breastfeeding females:  Teens: 1,300 mg.  Adults: 1,000 mg. Vitamin D Recommendations Vitamin D is the most essential vitamin for bone health. It helps the body to absorb calcium. Sunlight stimulates the skin to make vitamin D, so be sure to get enough sunlight. If you live in a cold climate or you do not get outside often, your health care provider may recommend that you take vitamin D supplements. Good   sources of vitamin D in your diet include:  Egg yolks.  Saltwater fish.  Milk and cereal fortified with vitamin D. Follow these recommended amounts for daily vitamin D intake:  Children and teens, age 1-18: 600 international units.  Adults, age 50 or younger: 400-800 international units.  Adults, age 51 or older: 800-1,000 international units. Other Nutrients Other nutrients for bone  health include:  Phosphorus. This mineral is found in meat, poultry, dairy foods, nuts, and legumes. The recommended daily intake for adult men and adult women is 700 mg.  Magnesium. This mineral is found in seeds, nuts, dark green vegetables, and legumes. The recommended daily intake for adult men is 400-420 mg. For adult women, it is 310-320 mg.  Vitamin K. This vitamin is found in green leafy vegetables. The recommended daily intake is 120 mg for adult men and 90 mg for adult women. WHAT TYPE OF PHYSICAL ACTIVITY IS BEST FOR BUILDING AND MAINTAINING HEALTHY BONES? Weight-bearing and strength-building activities are important for building and maintaining peak bone mass. Weight-bearing activities cause muscles and bones to work against gravity. Strength-building activities increases muscle strength that supports bones. Weight-bearing and muscle-building activities include:  Walking and hiking.  Jogging and running.  Dancing.  Gym exercises.  Lifting weights.  Tennis and racquetball.  Climbing stairs.  Aerobics. Adults should get at least 30 minutes of moderate physical activity on most days. Children should get at least 60 minutes of moderate physical activity on most days. Ask your health care provide what type of exercise is best for you. WHERE CAN I FIND MORE INFORMATION? For more information, check out the following websites:  National Osteoporosis Foundation: http://nof.org/learn/basics  National Institutes of Health: http://www.niams.nih.gov/Health_Info/Bone/Bone_Health/bone_health_for_life.asp   This information is not intended to replace advice given to you by your health care provider. Make sure you discuss any questions you have with your health care provider.   Document Released: 08/13/2003 Document Revised: 10/07/2014 Document Reviewed: 05/28/2014 Elsevier Interactive Patient Education 2016 Else[employer] Inc.  

## 2015-04-01 LAB — LIPID PANEL
CHOLESTEROL TOTAL: 144 mg/dL (ref 100–199)
Chol/HDL Ratio: 2.5 ratio units (ref 0.0–4.4)
HDL: 58 mg/dL (ref >39)
LDL CALC: 58 mg/dL (ref 0–99)
TRIGLYCERIDES: 138 mg/dL (ref 0–149)
VLDL Cholesterol Cal: 28 mg/dL (ref 5–40)

## 2015-04-01 LAB — CMP14+EGFR
ALK PHOS: 71 IU/L (ref 39–117)
ALT: 24 IU/L (ref 0–32)
AST: 20 IU/L (ref 0–40)
Albumin/Globulin Ratio: 2.2 (ref 1.1–2.5)
Albumin: 5.1 g/dL — ABNORMAL HIGH (ref 3.5–4.8)
BUN/Creatinine Ratio: 18 (ref 11–26)
BUN: 15 mg/dL (ref 8–27)
Bilirubin Total: 0.4 mg/dL (ref 0.0–1.2)
CHLORIDE: 96 mmol/L — AB (ref 97–106)
CO2: 23 mmol/L (ref 18–29)
CREATININE: 0.85 mg/dL (ref 0.57–1.00)
Calcium: 10.4 mg/dL — ABNORMAL HIGH (ref 8.7–10.3)
GFR calc Af Amer: 80 mL/min/{1.73_m2} (ref >59)
GFR calc non Af Amer: 69 mL/min/{1.73_m2} (ref >59)
GLOBULIN, TOTAL: 2.3 g/dL (ref 1.5–4.5)
GLUCOSE: 103 mg/dL — AB (ref 65–99)
Potassium: 5 mmol/L (ref 3.5–5.2)
SODIUM: 138 mmol/L (ref 136–144)
Total Protein: 7.4 g/dL (ref 6.0–8.5)

## 2015-04-06 ENCOUNTER — Telehealth: Payer: Self-pay | Admitting: Nurse Practitioner

## 2015-04-06 NOTE — Telephone Encounter (Signed)
Pt notified of results Verbalizes understanding 

## 2015-05-10 ENCOUNTER — Other Ambulatory Visit: Payer: Self-pay | Admitting: Nurse Practitioner

## 2015-07-21 ENCOUNTER — Telehealth: Payer: Self-pay | Admitting: Nurse Practitioner

## 2015-07-21 NOTE — Telephone Encounter (Signed)
Will call back.

## 2015-08-03 ENCOUNTER — Other Ambulatory Visit: Payer: Self-pay | Admitting: Nurse Practitioner

## 2015-08-04 NOTE — Telephone Encounter (Signed)
Last refill without being seen 

## 2015-08-04 NOTE — Telephone Encounter (Signed)
Last seen and last lipid 03/31/15  MMM   Requesting 90 day supply

## 2015-08-04 NOTE — Telephone Encounter (Signed)
Pt is aware but will have to CB to schedule appt due to transportation.

## 2015-09-02 ENCOUNTER — Ambulatory Visit: Payer: Self-pay | Admitting: Nurse Practitioner

## 2015-09-11 ENCOUNTER — Ambulatory Visit: Payer: Self-pay | Admitting: Nurse Practitioner

## 2015-10-30 ENCOUNTER — Encounter: Payer: Self-pay | Admitting: Nurse Practitioner

## 2015-10-30 ENCOUNTER — Ambulatory Visit (INDEPENDENT_AMBULATORY_CARE_PROVIDER_SITE_OTHER): Payer: Medicare Other | Admitting: Nurse Practitioner

## 2015-10-30 VITALS — BP 134/72 | HR 72 | Temp 96.8°F | Ht 66.0 in | Wt 155.0 lb

## 2015-10-30 DIAGNOSIS — M791 Myalgia, unspecified site: Secondary | ICD-10-CM

## 2015-10-30 DIAGNOSIS — E785 Hyperlipidemia, unspecified: Secondary | ICD-10-CM

## 2015-10-30 NOTE — Patient Instructions (Signed)
Muscle Pain, Adult  Muscle pain (myalgia) may be caused by many things, including:  · Overuse or muscle strain, especially if you are not in shape. This is the most common cause of muscle pain.  · Injury.  · Bruises.  · Viruses, such as the flu.  · Infectious diseases.  · Fibromyalgia, which is a chronic condition that causes muscle tenderness, fatigue, and headache.  · Autoimmune diseases, including lupus.  · Certain drugs, including ACE inhibitors and statins.  Muscle pain may be mild or severe. In most cases, the pain lasts only a short time and goes away without treatment. To diagnose the cause of your muscle pain, your health care provider will take your medical history. This means he or she will ask you when your muscle pain began and what has been happening. If you have not had muscle pain for very long, your health care provider may want to wait before doing much testing. If your muscle pain has lasted a long time, your health care provider may want to run tests right away. If your health care provider thinks your muscle pain may be caused by illness, you may need to have additional tests to rule out certain conditions.   Treatment for muscle pain depends on the cause. Home care is often enough to relieve muscle pain. Your health care provider may also prescribe anti-inflammatory medicine.  HOME CARE INSTRUCTIONS  Watch your condition for any changes. The following actions may help to lessen any discomfort you are feeling:  · Only take over-the-counter or prescription medicines as directed by your health care provider.  · Apply ice to the sore muscle:    Put ice in a plastic bag.    Place a towel between your skin and the bag.    Leave the ice on for 15-20 minutes, 3-4 times a day.  · You may alternate applying hot and cold packs to the muscle as directed by your health care provider.  · If overuse is causing your muscle pain, slow down your activities until the pain goes away.    Remember that it is normal  to feel some muscle pain after starting a workout program. Muscles that have not been used often will be sore at first.    Do regular, gentle exercises if you are not usually active.    Warm up before exercising to lower your risk of muscle pain.  · Do not continue working out if the pain is very bad. Bad pain could mean you have injured a muscle.  SEEK MEDICAL CARE IF:  · Your muscle pain gets worse, and medicines do not help.  · You have muscle pain that lasts longer than 3 days.  · You have a rash or fever along with muscle pain.  · You have muscle pain after a tick bite.  · You have muscle pain while working out, even though you are in good physical condition.  · You have redness, soreness, or swelling along with muscle pain.  · You have muscle pain after starting a new medicine or changing the dose of a medicine.  SEEK IMMEDIATE MEDICAL CARE IF:  · You have trouble breathing.  · You have trouble swallowing.  · You have muscle pain along with a stiff neck, fever, and vomiting.  · You have severe muscle weakness or cannot move part of your body.  MAKE SURE YOU:   · Understand these instructions.  · Will watch your condition.  · Will get   help right away if you are not doing well or get worse.     This information is not intended to replace advice given to you by your health care provider. Make sure you discuss any questions you have with your health care provider.     Document Released: 04/14/2006 Document Revised: 06/13/2014 Document Reviewed: 03/19/2013  Elsevier Interactive Patient Education ©2016 [employer] Inc[employer]

## 2015-10-30 NOTE — Progress Notes (Signed)
   Subjective:    Patient ID: Rebecca Wolfe, female    DOB: [birth year], 70 y.o.   MRN: [id number]  HPI  Patient is brought in today by her daughter. She is c/o leg pain everyday and she has had a fall about 2 weeks ago when she was outside doing yard work but she said she got dizzy out in the sun and that is what caused her to fall. SHe is not concerned about fall as much as she is about her legs hurting. SHe is on lipitor for her LDL- she said they hurt in the past when she tried taking a statin.   Review of Systems  Constitutional: Negative.   HENT: Negative.   Respiratory: Negative.   Cardiovascular: Negative.   Genitourinary: Negative.   Neurological: Negative.   Psychiatric/Behavioral: Negative.   All other systems reviewed and are negative.      Objective:   Physical Exam  Constitutional: She is oriented to person, place, and time. She appears well-developed and well-nourished. No distress.  Cardiovascular: Normal rate, regular rhythm and normal heart sounds.   Pulmonary/Chest: Effort normal and breath sounds normal.  Neurological: She is alert and oriented to person, place, and time.  Skin: Skin is warm.  Psychiatric: She has a normal mood and affect. Her behavior is normal. Judgment and thought content normal.   BP 134/72 mmHg  Pulse 72  Temp(Src) 96.8 F (36 C) (Oral)  Ht 5\' 6"  (1.676 m)  Wt 155 lb (70.308 kg)  BMI 25.03 kg/m2      Assessment & Plan:   1. Hyperlipidemia   2. Myalgia    Take lipitor QOD Fall precautions RTO for regular follow up  Mary-Margaret Daphine DeutscherMartin, FNP

## 2015-11-03 ENCOUNTER — Other Ambulatory Visit: Payer: Self-pay | Admitting: Nurse Practitioner

## 2016-01-20 ENCOUNTER — Telehealth: Payer: Self-pay | Admitting: Nurse Practitioner

## 2016-02-02 ENCOUNTER — Ambulatory Visit (INDEPENDENT_AMBULATORY_CARE_PROVIDER_SITE_OTHER): Payer: Medicare Other | Admitting: Nurse Practitioner

## 2016-02-02 ENCOUNTER — Encounter: Payer: Self-pay | Admitting: Nurse Practitioner

## 2016-02-02 VITALS — BP 123/67 | HR 80 | Temp 96.3°F | Ht 66.0 in | Wt 158.0 lb

## 2016-02-02 DIAGNOSIS — E785 Hyperlipidemia, unspecified: Secondary | ICD-10-CM | POA: Diagnosis not present

## 2016-02-02 DIAGNOSIS — I10 Essential (primary) hypertension: Secondary | ICD-10-CM

## 2016-02-02 DIAGNOSIS — Z1159 Encounter for screening for other viral diseases: Secondary | ICD-10-CM | POA: Diagnosis not present

## 2016-02-02 MED ORDER — LISINOPRIL-HYDROCHLOROTHIAZIDE 20-12.5 MG PO TABS: 2.0000 | ORAL_TABLET | Freq: Every day | ORAL | 1 refills | Status: DC

## 2016-02-02 NOTE — Progress Notes (Signed)
  Subjective:    Patient ID: Rebecca Wolfe, female    DOB: September 29, 1945, 70 y.o.   MRN: [id number]  Patient in today for follow up of chronic medical problems- SHe is doing well- Her only complaint today is intermittent leg cramops- these occur daily usually in the evening. She had one episodes of felling like her hands were drawing up but that resolved on it's own.  Hyperlipidemia  This is a chronic problem. The current episode started more than 1 year ago. The problem is controlled. Recent lipid tests were reviewed and are variable. Factors aggravating her hyperlipidemia include thiazides. Pertinent negatives include no chest pain or shortness of breath. Current antihyperlipidemic treatment includes statins. The current treatment provides moderate improvement of lipids. Compliance problems include adherence to diet and adherence to exercise.  Risk factors for coronary artery disease include dyslipidemia, hypertension and post-menopausal.  Hypertension  This is a chronic problem. The current episode started more than 1 year ago. The problem is unchanged. The problem is uncontrolled. Pertinent negatives include no chest pain, headaches, neck pain, palpitations or shortness of breath. Risk factors for coronary artery disease include dyslipidemia, post-menopausal state and sedentary lifestyle. Past treatments include ACE inhibitors and diuretics. The current treatment provides moderate improvement. Compliance problems include diet and exercise.   depression/GAD clexa working well- feels much better then she did at last visit- no c/o side effects.   Review of Systems  Respiratory: Negative for shortness of breath.   Cardiovascular: Negative for chest pain and palpitations.  Musculoskeletal: Negative for neck pain.  Neurological: Negative for headaches.  All other systems reviewed and are negative.      Objective:   Physical Exam  Constitutional: She is oriented to person, place, and time. She  appears well-developed and well-nourished.  HENT:  Nose: Nose normal.  Mouth/Throat: Oropharynx is clear and moist.  Eyes: EOM are normal.  Neck: Trachea normal, normal range of motion and full passive range of motion without pain. Neck supple. No JVD present. Carotid bruit is not present. No thyromegaly present.  Cardiovascular: Normal rate, regular rhythm, normal heart sounds and intact distal pulses.  Exam reveals no gallop and no friction rub.   No murmur heard. Pulmonary/Chest: Effort normal and breath sounds normal.  Abdominal: Soft. Bowel sounds are normal. She exhibits no distension and no mass. There is no tenderness.  Musculoskeletal: Normal range of motion.  Lymphadenopathy:    She has no cervical adenopathy.  Neurological: She is alert and oriented to person, place, and time. She has normal reflexes.  Skin: Skin is warm and dry.  Psychiatric: She has a normal mood and affect. Her behavior is normal. Judgment and thought content normal.    BP 123/67   Pulse 80   Temp (!) 96.3 F (35.7 C) (Oral)   Ht _0  (1.676 m)   Wt 158 lb (71.7 kg)   BMI 25.50 kg/m         Assessment & Plan:   1. Essential hypertension Do not add slat to diet - CMP14+EGFR  2. Hyperlipidemia Hold lipitor for next 3 months to see if helps with pain experiencing in legs Watch diet- low fat - Lipid panel    Labs pending Health maintenance reviewed Diet and exercise encouraged Continue all meds Follow up  In 3 months   [place], FNP

## 2016-02-03 LAB — CMP14+EGFR
A/G RATIO: 2.1 (ref 1.2–2.2)
ALK PHOS: 60 IU/L (ref 39–117)
ALT: 25 IU/L (ref 0–32)
AST: 23 IU/L (ref 0–40)
Albumin: 5.1 g/dL — ABNORMAL HIGH (ref 3.5–4.8)
BUN / CREAT RATIO: 20 (ref 12–28)
BUN: 23 mg/dL (ref 8–27)
Bilirubin Total: 0.3 mg/dL (ref 0.0–1.2)
CO2: 23 mmol/L (ref 18–29)
Calcium: 10 mg/dL (ref 8.7–10.3)
Chloride: 96 mmol/L (ref 96–106)
Creatinine, Ser: 1.13 mg/dL — ABNORMAL HIGH (ref 0.57–1.00)
GFR calc Af Amer: 56 mL/min/{1.73_m2} — ABNORMAL LOW (ref >59)
GFR calc non Af Amer: 49 mL/min/{1.73_m2} — ABNORMAL LOW (ref >59)
GLOBULIN, TOTAL: 2.4 g/dL (ref 1.5–4.5)
Glucose: 101 mg/dL — ABNORMAL HIGH (ref 65–99)
POTASSIUM: 4.7 mmol/L (ref 3.5–5.2)
SODIUM: 138 mmol/L (ref 134–144)
Total Protein: 7.5 g/dL (ref 6.0–8.5)

## 2016-02-03 LAB — LIPID PANEL
CHOL/HDL RATIO: 2.6 ratio (ref 0.0–4.4)
CHOLESTEROL TOTAL: 177 mg/dL (ref 100–199)
HDL: 68 mg/dL (ref >39)
LDL Calculated: 84 mg/dL (ref 0–99)
TRIGLYCERIDES: 123 mg/dL (ref 0–149)
VLDL Cholesterol Cal: 25 mg/dL (ref 5–40)

## 2016-02-03 LAB — HEPATITIS C ANTIBODY

## 2016-02-03 NOTE — Progress Notes (Deleted)
Subjective

## 2016-02-09 NOTE — Progress Notes (Signed)
   Subjective:    Patient ID: Rebecca Wolfe, female    DOB: 1946-05-01, 70 y.o.   MRN: [id number]  HPI    Review of Systems     Objective:   Physical Exam   Subjective:    Patient ID: Rebecca Wolfe, female    DOB: 1946-05-01, 70 y.o.   MRN: [id number]   Patient is brought in today by her daughter. She is here for the follow on her chronic conditions. She report having episodes of headache that comes with nausea. The headache is usually relieved with 2 tablets of  Ibuprophen,  She c/o leg pain everyday which has been ongoing, she think is related to her taking statin. She denies any fall in the last 3 months. Has a history of hypokalemia and was on supplements but only took for 1 month and never got refilled.    Hyperlipidemia  This is a chronic problem. The current episode started more than 1 year ago. The problem is controlled. Recent lipid tests were reviewed and are variable. Factors aggravating her hyperlipidemia include thiazides. Pertinent negatives include no chest pain or shortness of breath. Current antihyperlipidemic treatment includes statins. The current treatment provides moderate improvement of lipids. Compliance problems include adherence to diet and adherence to exercise.  Risk factors for coronary artery disease include dyslipidemia, hypertension and post-menopausal.  Hypertension  This is a chronic problem. The current episode started more than 1 year ago. The problem is unchanged. The problem is uncontrolled. Associated symptoms include headaches. Pertinent negatives include no chest pain, neck pain, palpitations or shortness of breath. Risk factors for coronary artery disease include dyslipidemia, post-menopausal state and sedentary lifestyle. Past treatments include ACE inhibitors and diuretics. The current treatment provides moderate improvement. Compliance problems include diet and exercise.         Review of Systems  Constitutional: Negative.  Negative  for appetite change.  Respiratory: Negative.  Negative for shortness of breath.   Cardiovascular: Negative.  Negative for chest pain and palpitations.  Genitourinary: Negative.   Musculoskeletal: Positive for arthralgias. Negative for neck pain.  Neurological: Positive for headaches. Negative for dizziness and light-headedness.  Psychiatric/Behavioral: Negative.   All other systems reviewed and are negative.      Objective:   Physical Exam  Constitutional: She is oriented to person, place, and time. She appears well-developed and well-nourished. No distress.  Eyes: Conjunctivae and EOM are normal. Pupils are equal, round, and reactive to light.  Cardiovascular: Normal rate, regular rhythm, normal heart sounds and intact distal pulses.   Pulmonary/Chest: Effort normal and breath sounds normal.  Musculoskeletal: Normal range of motion. She exhibits no tenderness.  Negative crepitus in bilateral upper and lower extremetis  Neurological: She is alert and oriented to person, place, and time.  Skin: Skin is warm.  Psychiatric: She has a normal mood and affect. Her behavior is normal. Judgment and thought content normal.   BP 123/67   Pulse 80   Temp (!) 96.3 F (35.7 C) (Oral)   Ht 5\' 6"  (1.676 m)   Wt 158 lb (71.7 kg)   BMI 25.50 kg/m       Assessment & Plan:        Assessment & Plan:  See not written by provider

## 2016-04-28 ENCOUNTER — Other Ambulatory Visit: Payer: Self-pay | Admitting: Nurse Practitioner

## 2016-05-18 ENCOUNTER — Telehealth: Payer: Self-pay | Admitting: Nurse Practitioner

## 2016-05-18 NOTE — Telephone Encounter (Signed)
Denied, pt says if changes mind she will call back to schedule

## 2016-08-03 ENCOUNTER — Encounter: Payer: Self-pay | Admitting: Nurse Practitioner

## 2016-08-03 ENCOUNTER — Ambulatory Visit (INDEPENDENT_AMBULATORY_CARE_PROVIDER_SITE_OTHER): Payer: Medicare Other | Admitting: Nurse Practitioner

## 2016-08-03 VITALS — BP 142/74 | HR 79 | Temp 96.8°F | Ht 66.0 in | Wt 154.0 lb

## 2016-08-03 DIAGNOSIS — I10 Essential (primary) hypertension: Secondary | ICD-10-CM

## 2016-08-03 DIAGNOSIS — E782 Mixed hyperlipidemia: Secondary | ICD-10-CM

## 2016-08-03 MED ORDER — ATORVASTATIN CALCIUM 40 MG PO TABS: 40.0000 mg | ORAL_TABLET | Freq: Every day | ORAL | 1 refills | Status: DC

## 2016-08-03 MED ORDER — LISINOPRIL-HYDROCHLOROTHIAZIDE 20-12.5 MG PO TABS: 2.0000 | ORAL_TABLET | Freq: Every day | ORAL | 1 refills | Status: DC

## 2016-08-03 NOTE — Progress Notes (Signed)
  Subjective:    Patient ID: Rebecca Wolfe, female    DOB: [birth year], 71 y.o.   MRN: [id number]  Patient here today for follow up of chronic medical problems.  Outpatient Encounter Prescriptions as of 08/03/2016  Medication Sig  . lisinopril-hydrochlorothiazide (PRINZIDE,ZESTORETIC) 20-12.5 MG tablet Take 2 tablets by mouth daily.  Marland Kitchen atorvastatin (LIPITOR) 40 MG tablet TAKE 1 TABLET (40 MG TOTAL) BY MOUTH DAILY. (Patient not taking: Reported on 08/03/2016)    Hyperlipidemia  This is a chronic problem. The current episode started more than 1 year ago. The problem is controlled. Recent lipid tests were reviewed and are variable. Factors aggravating her hyperlipidemia include thiazides. Pertinent negatives include no chest pain or shortness of breath. Current antihyperlipidemic treatment includes statins. The current treatment provides moderate improvement of lipids. Compliance problems include adherence to diet and adherence to exercise.  Risk factors for coronary artery disease include dyslipidemia, hypertension and post-menopausal.  Hypertension  This is a chronic problem. The current episode started more than 1 year ago. The problem is unchanged. The problem is uncontrolled. Pertinent negatives include no chest pain, headaches, neck pain, palpitations or shortness of breath. Risk factors for coronary artery disease include dyslipidemia, post-menopausal state and sedentary lifestyle. Past treatments include ACE inhibitors and diuretics. The current treatment provides moderate improvement. Compliance problems include diet and exercise.   depression/GAD clexa working well- feels much better then she did at last visit- no c/o side effects.   Review of Systems  Respiratory: Negative for shortness of breath.   Cardiovascular: Negative for chest pain and palpitations.  Musculoskeletal: Negative for neck pain.  Neurological: Negative for headaches.  All other systems reviewed and are negative.     Objective:   Physical Exam  Constitutional: She is oriented to person, place, and time. She appears well-developed and well-nourished.  HENT:  Nose: Nose normal.  Mouth/Throat: Oropharynx is clear and moist.  Eyes: EOM are normal.  Neck: Trachea normal, normal range of motion and full passive range of motion without pain. Neck supple. No JVD present. Carotid bruit is not present. No thyromegaly present.  Cardiovascular: Normal rate, regular rhythm, normal heart sounds and intact distal pulses.  Exam reveals no gallop and no friction rub.   No murmur heard. Pulmonary/Chest: Effort normal and breath sounds normal.  Abdominal: Soft. Bowel sounds are normal. She exhibits no distension and no mass. There is no tenderness.  Musculoskeletal: Normal range of motion.  Lymphadenopathy:    She has no cervical adenopathy.  Neurological: She is alert and oriented to person, place, and time. She has normal reflexes.  Skin: Skin is warm and dry.  Psychiatric: She has a normal mood and affect. Her behavior is normal. Judgment and thought content normal.   BP (!) 142/74 (BP Location: Left Arm, Cuff Size: Normal)   Pulse 79   Temp (!) 96.8 F (36 C) (Oral)   Ht '5\' 6"'$  (1.676 m)   Wt 154 lb (69.9 kg)   BMI 24.86 kg/m       Assessment & Plan:   1. Essential hypertension Low sodium diet - lisinopril-hydrochlorothiazide (PRINZIDE,ZESTORETIC) 20-12.5 MG tablet; Take 2 tablets by mouth daily.  Dispense: 180 tablet; Refill: 1 - CMP14+EGFR  2. Mixed hyperlipidemia Low fta diet - Lipid panel    Labs pending Health maintenance reviewed Diet and exercise encouraged Continue all meds Follow up  In 6 months   [place], FNP

## 2016-08-03 NOTE — Patient Instructions (Signed)
Bone Health Bones protect organs, store calcium, and anchor muscles. Good health habits, such as eating nutritious foods and exercising regularly, are important for maintaining healthy bones. They can also help to prevent a condition that causes bones to lose density and become weak and brittle (osteoporosis). Why is bone mass important? Bone mass refers to the amount of bone tissue that you have. The higher your bone mass, the stronger your bones. An important step toward having healthy bones throughout life is to have strong and dense bones during childhood. A young adult who has a high bone mass is more likely to have a high bone mass later in life. Bone mass at its greatest it is called peak bone mass. A large decline in bone mass occurs in older adults. In women, it occurs about the time of menopause. During this time, it is important to practice good health habits, because if more bone is lost than what is replaced, the bones will become less healthy and more likely to break (fracture). If you find that you have a low bone mass, you may be able to prevent osteoporosis or further bone loss by changing your diet and lifestyle. How can I find out if my bone mass is low? Bone mass can be measured with an X-ray test that is called a bone mineral density (BMD) test. This test is recommended for all women who are age 65 or older. It may also be recommended for men who are age 70 or older, or for people who are more likely to develop osteoporosis due to:  Having bones that break easily.  Having a long-term disease that weakens bones, such as kidney disease or rheumatoid arthritis.  Having menopause earlier than normal.  Taking medicine that weakens bones, such as steroids, thyroid hormones, or hormone treatment for breast cancer or prostate cancer.  Smoking.  Drinking three or more alcoholic drinks each day. What are the nutritional recommendations for healthy bones? To have healthy bones, you need  to get enough of the right minerals and vitamins. Most nutrition experts recommend getting these nutrients from the foods that you eat. Nutritional recommendations vary from person to person. Ask your health care provider what is healthy for you. Here are some general guidelines. Calcium Recommendations  Calcium is the most important (essential) mineral for bone health. Most people can get enough calcium from their diet, but supplements may be recommended for people who are at risk for osteoporosis. Good sources of calcium include:  Dairy products, such as low-fat or nonfat milk, cheese, and yogurt.  Dark green leafy vegetables, such as bok choy and broccoli.  Calcium-fortified foods, such as orange juice, cereal, bread, soy beverages, and tofu products.  Nuts, such as almonds. Follow these recommended amounts for daily calcium intake:  Children, age 1?3: 700 mg.  Children, age 4?8: 1,000 mg.  Children, age 9?13: 1,300 mg.  Teens, age 14?18: 1,300 mg.  Adults, age 19?50: 1,000 mg.  Adults, age 51?70:  Men: 1,000 mg.  Women: 1,200 mg.  Adults, age 71 or older: 1,200 mg.  Pregnant and breastfeeding females:  Teens: 1,300 mg.  Adults: 1,000 mg. Vitamin D Recommendations  Vitamin D is the most essential vitamin for bone health. It helps the body to absorb calcium. Sunlight stimulates the skin to make vitamin D, so be sure to get enough sunlight. If you live in a cold climate or you do not get outside often, your health care provider may recommend that you take vitamin D   supplements. Good sources of vitamin D in your diet include:  Egg yolks.  Saltwater fish.  Milk and cereal fortified with vitamin D. Follow these recommended amounts for daily vitamin D intake:  Children and teens, age 1?18: 600 international units.  Adults, age 50 or younger: 400-800 international units.  Adults, age 51 or older: 800-1,000 international units. Other Nutrients  Other nutrients for bone  health include:  Phosphorus. This mineral is found in meat, poultry, dairy foods, nuts, and legumes. The recommended daily intake for adult men and adult women is 700 mg.  Magnesium. This mineral is found in seeds, nuts, dark green vegetables, and legumes. The recommended daily intake for adult men is 400?420 mg. For adult women, it is 310?320 mg.  Vitamin K. This vitamin is found in green leafy vegetables. The recommended daily intake is 120 mg for adult men and 90 mg for adult women. What type of physical activity is best for building and maintaining healthy bones? Weight-bearing and strength-building activities are important for building and maintaining peak bone mass. Weight-bearing activities cause muscles and bones to work against gravity. Strength-building activities increases muscle strength that supports bones. Weight-bearing and muscle-building activities include:  Walking and hiking.  Jogging and running.  Dancing.  Gym exercises.  Lifting weights.  Tennis and racquetball.  Climbing stairs.  Aerobics. Adults should get at least 30 minutes of moderate physical activity on most days. Children should get at least 60 minutes of moderate physical activity on most days. Ask your health care provide what type of exercise is best for you. Where can I find more information? For more information, check out the following websites:  National Osteoporosis Foundation: http://nof.org/learn/basics  National Institutes of Health: http://www.niams.nih.gov/Health_Info/Bone/Bone_Health/bone_health_for_life.asp This information is not intended to replace advice given to you by your health care provider. Make sure you discuss any questions you have with your health care provider. Document Released: 08/13/2003 Document Revised: 12/11/2015 Document Reviewed: 05/28/2014 Elsevier Interactive Patient Education  2017 Elsevier Inc.  

## 2016-08-04 LAB — CMP14+EGFR
ALBUMIN: 4.9 g/dL — AB (ref 3.5–4.8)
ALK PHOS: 67 IU/L (ref 39–117)
ALT: 20 IU/L (ref 0–32)
AST: 21 IU/L (ref 0–40)
Albumin/Globulin Ratio: 1.9 (ref 1.2–2.2)
BUN/Creatinine Ratio: 9 — ABNORMAL LOW (ref 12–28)
BUN: 9 mg/dL (ref 8–27)
Bilirubin Total: 0.4 mg/dL (ref 0.0–1.2)
CALCIUM: 10 mg/dL (ref 8.7–10.3)
CO2: 22 mmol/L (ref 18–29)
CREATININE: 0.99 mg/dL (ref 0.57–1.00)
Chloride: 93 mmol/L — ABNORMAL LOW (ref 96–106)
GFR calc Af Amer: 66 mL/min/{1.73_m2} (ref >59)
GFR calc non Af Amer: 57 mL/min/{1.73_m2} — ABNORMAL LOW (ref >59)
GLOBULIN, TOTAL: 2.6 g/dL (ref 1.5–4.5)
Glucose: 88 mg/dL (ref 65–99)
Potassium: 4.6 mmol/L (ref 3.5–5.2)
SODIUM: 136 mmol/L (ref 134–144)
Total Protein: 7.5 g/dL (ref 6.0–8.5)

## 2016-08-04 LAB — LIPID PANEL
CHOLESTEROL TOTAL: 228 mg/dL — AB (ref 100–199)
Chol/HDL Ratio: 3.9 ratio units (ref 0.0–4.4)
HDL: 59 mg/dL (ref >39)
LDL CALC: 143 mg/dL — AB (ref 0–99)
TRIGLYCERIDES: 130 mg/dL (ref 0–149)
VLDL CHOLESTEROL CAL: 26 mg/dL (ref 5–40)

## 2016-08-05 ENCOUNTER — Telehealth: Payer: Self-pay | Admitting: Nurse Practitioner

## 2016-08-08 NOTE — Telephone Encounter (Signed)
She does not need cholesterol meds right now

## 2016-08-08 NOTE — Telephone Encounter (Signed)
Left detailed message advising pt she doesn't need cholesterol meds at this time and to call back with any further questions or concerns.

## 2016-11-10 ENCOUNTER — Ambulatory Visit (INDEPENDENT_AMBULATORY_CARE_PROVIDER_SITE_OTHER): Payer: Medicare Other | Admitting: Nurse Practitioner

## 2016-11-10 ENCOUNTER — Ambulatory Visit (INDEPENDENT_AMBULATORY_CARE_PROVIDER_SITE_OTHER): Payer: Medicare Other

## 2016-11-10 ENCOUNTER — Encounter: Payer: Self-pay | Admitting: Nurse Practitioner

## 2016-11-10 VITALS — BP 138/68 | HR 84 | Temp 97.8°F | Ht 66.0 in | Wt 154.0 lb

## 2016-11-10 DIAGNOSIS — M79671 Pain in right foot: Secondary | ICD-10-CM | POA: Diagnosis not present

## 2016-11-10 DIAGNOSIS — M7731 Calcaneal spur, right foot: Secondary | ICD-10-CM

## 2016-11-10 MED ORDER — PREDNISONE 10 MG (21) PO TBPK: ORAL_TABLET | ORAL | 0 refills | Status: DC

## 2016-11-10 NOTE — Progress Notes (Signed)
   Subjective:    Patient ID: Rebecca Wolfe, female    DOB: 20-Dec-1945, 71 y.o.   MRN: [id number]  HPI Patient comes into the office complaining of right foot and heel pain for the past several weeks.  She states she is less able to get around like she used to and has not been able to walk her usual 3-4 miles a day.  Her heel hurts bad enough that she feels like she can't put much weight on it unless she's wearing certain shoes that have a slight heel.  Patient is accompanied in the office by her daughter.  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   Review of Systems  Cardiovascular: Negative for chest pain and palpitations.  Gastrointestinal: Negative for abdominal distention and abdominal pain.  Musculoskeletal: Positive for joint swelling. Negative for back pain.       Right foot and heel pain x several weeks   All other systems reviewed and are negative.      Objective:   Physical Exam  Constitutional: She is oriented to person, place, and time. She appears well-developed and well-nourished. No distress.  HENT:  Head: Normocephalic.  Right Ear: External ear normal.  Left Ear: External ear normal.  Mouth/Throat: Oropharynx is clear and moist.  Eyes: Pupils are equal, round, and reactive to light.  Neck: Normal range of motion. Neck supple. No JVD present. No thyromegaly present.  Cardiovascular: Normal rate, regular rhythm, normal heart sounds and intact distal pulses.   No murmur heard. Pulmonary/Chest: Effort normal and breath sounds normal. No respiratory distress.  Abdominal: Soft. Bowel sounds are normal. She exhibits no distension. There is no tenderness.  Musculoskeletal: Normal range of motion. She exhibits edema (+1 bilateral lower extremities) and tenderness (right heel).  Lymphadenopathy:    She has no cervical adenopathy.  Neurological: She is alert and oriented to person, place, and time.  Skin: Skin is  warm and dry.  Psychiatric: She has a normal mood and affect. Her behavior is normal. Judgment and thought content normal.   BP 138/68   Pulse 84   Temp 97.8 F (36.6 C) (Oral)   Ht 5\' 6"  (1.676 m)   Wt 154 lb (69.9 kg)   BMI 24.86 kg/m       Assessment & Plan:   1. Right foot pain   2. Calcaneal spur of right foot    Meds ordered this encounter  Medications  . predniSONE (STERAPRED UNI-PAK 21 TAB) 10 MG (21) TBPK tablet    Sig: As directed x 6 days    Dispense:  21 tablet    Refill:  0    Order Specific Question:   Supervising Provider    Answer:   Rex KrasVINCENT, CAROL L [[id number]]   Heel cups for shoes Do ot go barefooted RTO prn  Rebecca Daphine DeutscherMartin, FNP

## 2016-11-10 NOTE — Patient Instructions (Signed)
Heel Spur A heel spur is a bony growth that forms on the bottom of your heel bone (calcaneus). Heel spurs are common and do not always cause pain. However, heel spurs often cause inflammation in the strong band of tissue that runs underneath the bone of your foot (plantar fascia). When this happens, you may feel pain on the bottom of your foot, near your heel. What are the causes? The cause of heel spurs is not completely understood. They may be caused by pressure on the heel. Or, they may stem from the muscle attachments (tendons) near the spur pulling on the heel. What increases the risk? You may be at risk for a heel spur if you:  Are older than 40.  Are overweight.  Have wear and tear arthritis (osteoarthritis).  Have plantar fascia inflammation.  What are the signs or symptoms? Some people have heel spurs but no symptoms. If you do have symptoms, they may include:  Pain in the bottom of your heel.  Pain that is worse when you first get out of bed.  Pain that gets worse after walking or standing.  How is this diagnosed? Your health care provider may diagnose a heel spur based on your symptoms and a physical exam. You may also have an X-ray of your foot to check for a bony growth coming from the calcaneus. How is this treated? Treatment aims to relieve the pain from the heel spur. This may include:  Stretching exercises.  Losing weight.  Wearing specific shoes, inserts, or orthotics for comfort and support.  Wearing splints at night to properly position your feet.  Taking over-the-counter medicine to relieve pain.  Being treated with high-intensity sound waves to break up the heel spur (extracorporeal shock wave therapy).  Getting steroid injections in your heel to reduce swelling and ease pain.  Having surgery if your heel spur causes long-term (chronic) pain.  Follow these instructions at home:  Take medicines only as directed by your health care provider.  Ask  your health care provider if you should use ice or cold packs on the painful areas of your heel or foot.  Avoid activities that cause you pain until you recover or as directed by your health care provider.  Stretch before exercising or being physically active.  Wear supportive shoes that fit well as directed by your health care provider. You might need to buy new shoes. Wearing old shoes or shoes that do not fit correctly may not provide the support that you need.  Lose weight if your health care provider thinks you should. This can relieve pressure on your foot that may be causing pain and discomfort. Contact a health care provider if:  Your pain continues or gets worse. This information is not intended to replace advice given to you by your health care provider. Make sure you discuss any questions you have with your health care provider. Document Released: 06/29/2005 Document Revised: 10/29/2015 Document Reviewed: 07/24/2013 Elsevier Interactive Patient Education  2018 Elsevier Inc.  

## 2016-11-23 ENCOUNTER — Telehealth: Payer: Self-pay | Admitting: *Deleted

## 2016-11-23 DIAGNOSIS — M7731 Calcaneal spur, right foot: Secondary | ICD-10-CM

## 2016-11-23 NOTE — Telephone Encounter (Signed)
[facility] so she wants referral cancelled and will call Dr Ulice Brilliantrake

## 2016-11-23 NOTE — Telephone Encounter (Signed)
Ok for a referral to [facility] [facility] per Dr Oswaldo DoneVincent

## 2016-11-23 NOTE — Addendum Note (Signed)
Addended by: Henrietta DineSTOVALL, JILL C on: 11/23/2016 04:41 PM   Modules accepted: Orders

## 2016-11-23 NOTE — Telephone Encounter (Signed)
Seen Rebecca Wolfe for heel pain and was given prednisone for heel spurs. Still having heel pain. Can she have a referral to [facility] for a heel injection

## 2016-12-08 DIAGNOSIS — M722 Plantar fascial fibromatosis: Secondary | ICD-10-CM | POA: Diagnosis not present

## 2017-02-08 DIAGNOSIS — M722 Plantar fascial fibromatosis: Secondary | ICD-10-CM | POA: Diagnosis not present

## 2017-02-08 DIAGNOSIS — M79671 Pain in right foot: Secondary | ICD-10-CM | POA: Diagnosis not present

## 2017-04-11 ENCOUNTER — Other Ambulatory Visit: Payer: Self-pay | Admitting: Nurse Practitioner

## 2017-04-11 DIAGNOSIS — I10 Essential (primary) hypertension: Secondary | ICD-10-CM

## 2017-04-11 NOTE — Telephone Encounter (Signed)
OV 04/18/17 

## 2017-04-18 ENCOUNTER — Encounter: Payer: Self-pay | Admitting: Nurse Practitioner

## 2017-04-18 ENCOUNTER — Ambulatory Visit (INDEPENDENT_AMBULATORY_CARE_PROVIDER_SITE_OTHER): Payer: Medicare Other | Admitting: Nurse Practitioner

## 2017-04-18 VITALS — BP 137/77 | HR 79 | Temp 98.0°F | Ht 66.0 in | Wt 147.0 lb

## 2017-04-18 DIAGNOSIS — I10 Essential (primary) hypertension: Secondary | ICD-10-CM

## 2017-04-18 DIAGNOSIS — E782 Mixed hyperlipidemia: Secondary | ICD-10-CM

## 2017-04-18 DIAGNOSIS — M7731 Calcaneal spur, right foot: Secondary | ICD-10-CM

## 2017-04-18 DIAGNOSIS — Z23 Encounter for immunization: Secondary | ICD-10-CM

## 2017-04-18 MED ORDER — LISINOPRIL-HYDROCHLOROTHIAZIDE 20-12.5 MG PO TABS: 2.0000 | ORAL_TABLET | Freq: Every day | ORAL | 1 refills | Status: DC

## 2017-04-18 MED ORDER — BUPIVACAINE HCL 0.25 % IJ SOLN
1.0000 mL | Freq: Once | INTRAMUSCULAR | Status: AC
  Administered 2017-04-18: 1 mL via INTRA_ARTICULAR

## 2017-04-18 MED ORDER — METHYLPREDNISOLONE ACETATE 40 MG/ML IJ SUSP
40.0000 mg | Freq: Once | INTRAMUSCULAR | Status: AC
  Administered 2017-04-18: 40 mg via INTRAMUSCULAR

## 2017-04-18 NOTE — Progress Notes (Signed)
   Subjective:    Patient ID: Rebecca Wolfe, female    DOB: 07/24/1945, 71 y.o.   MRN: [id number]  HPI  Rebecca Wolfe is here today for follow up of chronic medical problem.  Outpatient Encounter Medications as of 04/18/2017  Medication Sig  . lisinopril-hydrochlorothiazide (PRINZIDE,ZESTORETIC) 20-12.5 MG tablet TAKE 2 TABLETS BY MOUTH DAILY.    1. Essential hypertension  Does not c/o chest pain, sob or headache. Does not check blood pressures at home.  2. Mixed hyperlipidemia  Does not watch diet. Not taking statin    New complaints: Has heel spur on right that is bothering her- has taken oral steroids which helped a little.   Social history: Lives alone- daughter checks on her frequently    Review of Systems  Constitutional: Negative for activity change and appetite change.  HENT: Negative.   Eyes: Negative for pain.  Respiratory: Negative for shortness of breath.   Cardiovascular: Negative for chest pain, palpitations and leg swelling.  Gastrointestinal: Negative for abdominal pain.  Endocrine: Negative for polydipsia.  Genitourinary: Negative.   Skin: Negative for rash.  Neurological: Negative for dizziness, weakness and headaches.  Hematological: Does not bruise/bleed easily.  Psychiatric/Behavioral: Negative.   All other systems reviewed and are negative.      Objective:   Physical Exam  Constitutional: She is oriented to person, place, and time. She appears well-developed and well-nourished.  HENT:  Nose: Nose normal.  Mouth/Throat: Oropharynx is clear and moist.  Eyes: EOM are normal.  Neck: Trachea normal, normal range of motion and full passive range of motion without pain. Neck supple. No JVD present. Carotid bruit is not present. No thyromegaly present.  Cardiovascular: Normal rate, regular rhythm, normal heart sounds and intact distal pulses. Exam reveals no gallop and no friction rub.  No murmur heard. Pulmonary/Chest: Effort normal and breath  sounds normal.  Abdominal: Soft. Bowel sounds are normal. She exhibits no distension and no mass. There is no tenderness.  Musculoskeletal: Normal range of motion.  Right heel pain on palpation  Lymphadenopathy:    She has no cervical adenopathy.  Neurological: She is alert and oriented to person, place, and time. She has normal reflexes.  Skin: Skin is warm and dry.  Psychiatric: She has a normal mood and affect. Her behavior is normal. Judgment and thought content normal.   BP 137/77   Pulse 79   Temp 98 F (36.7 C) (Oral)   Ht 5\' 6"  (1.676 m)   Wt 147 lb (66.7 kg)   BMI 23.73 kg/m   Procedure:  Betadine prep  Injection marcaine 0.25 1ml with depomedrol 40mg  1ml injected in right heel     Assessment & Plan:  1. Essential hypertension Low sodium diet - lisinopril-hydrochlorothiazide (PRINZIDE,ZESTORETIC) 20-12.5 MG tablet; Take 2 tablets daily by mouth.  Dispense: 180 tablet; Refill: 1  2. Mixed hyperlipidemia Low fat diet  3. Calcaneal spur of right foot Heel injection Ice bid - methylPREDNISolone acetate (DEPO-MEDROL) injection 40 mg - bupivacaine (MARCAINE) 0.25 % (with pres) injection 1 mL    Labs pending Health maintenance reviewed Diet and exercise encouraged Continue all meds Follow up  In 3 months   Mary-Margaret Daphine DeutscherMartin, FNP

## 2017-04-18 NOTE — Patient Instructions (Signed)
Heel Spur A heel spur is a bony growth that forms on the bottom of your heel bone (calcaneus). Heel spurs are common and do not always cause pain. However, heel spurs often cause inflammation in the strong band of tissue that runs underneath the bone of your foot (plantar fascia). When this happens, you may feel pain on the bottom of your foot, near your heel. What are the causes? The cause of heel spurs is not completely understood. They may be caused by pressure on the heel. Or, they may stem from the muscle attachments (tendons) near the spur pulling on the heel. What increases the risk? You may be at risk for a heel spur if you:  Are older than 40.  Are overweight.  Have wear and tear arthritis (osteoarthritis).  Have plantar fascia inflammation.  What are the signs or symptoms? Some people have heel spurs but no symptoms. If you do have symptoms, they may include:  Pain in the bottom of your heel.  Pain that is worse when you first get out of bed.  Pain that gets worse after walking or standing.  How is this diagnosed? Your health care provider may diagnose a heel spur based on your symptoms and a physical exam. You may also have an X-ray of your foot to check for a bony growth coming from the calcaneus. How is this treated? Treatment aims to relieve the pain from the heel spur. This may include:  Stretching exercises.  Losing weight.  Wearing specific shoes, inserts, or orthotics for comfort and support.  Wearing splints at night to properly position your feet.  Taking over-the-counter medicine to relieve pain.  Being treated with high-intensity sound waves to break up the heel spur (extracorporeal shock wave therapy).  Getting steroid injections in your heel to reduce swelling and ease pain.  Having surgery if your heel spur causes long-term (chronic) pain.  Follow these instructions at home:  Take medicines only as directed by your health care provider.  Ask  your health care provider if you should use ice or cold packs on the painful areas of your heel or foot.  Avoid activities that cause you pain until you recover or as directed by your health care provider.  Stretch before exercising or being physically active.  Wear supportive shoes that fit well as directed by your health care provider. You might need to buy new shoes. Wearing old shoes or shoes that do not fit correctly may not provide the support that you need.  Lose weight if your health care provider thinks you should. This can relieve pressure on your foot that may be causing pain and discomfort. Contact a health care provider if:  Your pain continues or gets worse. This information is not intended to replace advice given to you by your health care provider. Make sure you discuss any questions you have with your health care provider. Document Released: 06/29/2005 Document Revised: 10/29/2015 Document Reviewed: 07/24/2013 Elsevier Interactive Patient Education  2018 Elsevier Inc.  

## 2017-04-19 LAB — CMP14+EGFR
A/G RATIO: 2.2 (ref 1.2–2.2)
ALK PHOS: 62 IU/L (ref 39–117)
ALT: 22 IU/L (ref 0–32)
AST: 22 IU/L (ref 0–40)
Albumin: 5.2 g/dL — ABNORMAL HIGH (ref 3.5–4.8)
BILIRUBIN TOTAL: 0.2 mg/dL (ref 0.0–1.2)
BUN/Creatinine Ratio: 9 — ABNORMAL LOW (ref 12–28)
BUN: 9 mg/dL (ref 8–27)
CALCIUM: 10.1 mg/dL (ref 8.7–10.3)
CHLORIDE: 100 mmol/L (ref 96–106)
CO2: 24 mmol/L (ref 20–29)
Creatinine, Ser: 1.03 mg/dL — ABNORMAL HIGH (ref 0.57–1.00)
GFR calc Af Amer: 62 mL/min/{1.73_m2} (ref >59)
GFR, EST NON AFRICAN AMERICAN: 54 mL/min/{1.73_m2} — AB (ref >59)
GLOBULIN, TOTAL: 2.4 g/dL (ref 1.5–4.5)
Glucose: 96 mg/dL (ref 65–99)
POTASSIUM: 4.4 mmol/L (ref 3.5–5.2)
SODIUM: 141 mmol/L (ref 134–144)
Total Protein: 7.6 g/dL (ref 6.0–8.5)

## 2017-04-19 LAB — LIPID PANEL
CHOL/HDL RATIO: 4.3 ratio (ref 0.0–4.4)
Cholesterol, Total: 263 mg/dL — ABNORMAL HIGH (ref 100–199)
HDL: 61 mg/dL (ref >39)
LDL CALC: 174 mg/dL — AB (ref 0–99)
Triglycerides: 141 mg/dL (ref 0–149)
VLDL Cholesterol Cal: 28 mg/dL (ref 5–40)

## 2017-04-21 ENCOUNTER — Other Ambulatory Visit: Payer: Self-pay | Admitting: Nurse Practitioner

## 2017-04-21 MED ORDER — ROSUVASTATIN CALCIUM 10 MG PO TABS: 10.0000 mg | ORAL_TABLET | Freq: Every day | ORAL | 5 refills | Status: DC

## 2017-04-26 ENCOUNTER — Telehealth: Payer: Self-pay | Admitting: Nurse Practitioner

## 2017-04-26 MED ORDER — ROSUVASTATIN CALCIUM 10 MG PO TABS: 10.0000 mg | ORAL_TABLET | Freq: Every day | ORAL | 5 refills | Status: DC

## 2017-04-26 NOTE — Telephone Encounter (Signed)
Pt was calling back to check and see if her Crestor has been prior authed.

## 2017-04-26 NOTE — Telephone Encounter (Signed)
Spoke to pt and she states when she called [facility] they didn't even have a prescription for her. Rx for crestor 10 mg sent over and pt aware.

## 2017-04-26 NOTE — Telephone Encounter (Signed)
Will send to MMM to advise.  Patient notified that change will be made and she will be contacted Friday 04/28/17 when completed.

## 2017-04-26 NOTE — Telephone Encounter (Signed)
I do not have a prior auth for Crestor on this patient

## 2017-04-28 MED ORDER — ATORVASTATIN CALCIUM 40 MG PO TABS: 40.0000 mg | ORAL_TABLET | Freq: Every day | ORAL | 1 refills | Status: DC

## 2017-04-28 NOTE — Telephone Encounter (Signed)
cestor changed to lipitor- rx sent to pharmacy

## 2017-05-01 ENCOUNTER — Other Ambulatory Visit: Payer: Self-pay | Admitting: *Deleted

## 2017-10-17 ENCOUNTER — Encounter: Payer: Self-pay | Admitting: Nurse Practitioner

## 2017-10-17 ENCOUNTER — Ambulatory Visit (INDEPENDENT_AMBULATORY_CARE_PROVIDER_SITE_OTHER): Payer: Medicare Other | Admitting: Nurse Practitioner

## 2017-10-17 VITALS — BP 122/70 | HR 70 | Temp 96.8°F | Ht 66.0 in | Wt 139.0 lb

## 2017-10-17 DIAGNOSIS — I10 Essential (primary) hypertension: Secondary | ICD-10-CM | POA: Diagnosis not present

## 2017-10-17 DIAGNOSIS — E782 Mixed hyperlipidemia: Secondary | ICD-10-CM

## 2017-10-17 LAB — CMP14+EGFR
ALBUMIN: 5.1 g/dL — AB (ref 3.5–4.8)
ALT: 24 IU/L (ref 0–32)
AST: 25 IU/L (ref 0–40)
Albumin/Globulin Ratio: 2.6 — ABNORMAL HIGH (ref 1.2–2.2)
Alkaline Phosphatase: 63 IU/L (ref 39–117)
BUN / CREAT RATIO: 10 — AB (ref 12–28)
BUN: 10 mg/dL (ref 8–27)
Bilirubin Total: 0.4 mg/dL (ref 0.0–1.2)
CALCIUM: 10.1 mg/dL (ref 8.7–10.3)
CO2: 23 mmol/L (ref 20–29)
Chloride: 96 mmol/L (ref 96–106)
Creatinine, Ser: 1.03 mg/dL — ABNORMAL HIGH (ref 0.57–1.00)
GFR calc Af Amer: 62 mL/min/{1.73_m2} (ref >59)
GFR, EST NON AFRICAN AMERICAN: 54 mL/min/{1.73_m2} — AB (ref >59)
GLOBULIN, TOTAL: 2 g/dL (ref 1.5–4.5)
Glucose: 92 mg/dL (ref 65–99)
Potassium: 4.7 mmol/L (ref 3.5–5.2)
SODIUM: 136 mmol/L (ref 134–144)
TOTAL PROTEIN: 7.1 g/dL (ref 6.0–8.5)

## 2017-10-17 LAB — LIPID PANEL
CHOL/HDL RATIO: 2.6 ratio (ref 0.0–4.4)
Cholesterol, Total: 147 mg/dL (ref 100–199)
HDL: 56 mg/dL (ref >39)
LDL Calculated: 74 mg/dL (ref 0–99)
TRIGLYCERIDES: 86 mg/dL (ref 0–149)
VLDL Cholesterol Cal: 17 mg/dL (ref 5–40)

## 2017-10-17 MED ORDER — ATORVASTATIN CALCIUM 40 MG PO TABS: 40.0000 mg | ORAL_TABLET | Freq: Every day | ORAL | 1 refills | Status: DC

## 2017-10-17 MED ORDER — LISINOPRIL-HYDROCHLOROTHIAZIDE 20-12.5 MG PO TABS: 2.0000 | ORAL_TABLET | Freq: Every day | ORAL | 1 refills | Status: DC

## 2017-10-17 NOTE — Patient Instructions (Signed)
DASH Eating Plan DASH stands for "Dietary Approaches to Stop Hypertension." The DASH eating plan is a healthy eating plan that has been shown to reduce high blood pressure (hypertension). It may also reduce your risk for type 2 diabetes, heart disease, and stroke. The DASH eating plan may also help with weight loss. What are tips for following this plan? General guidelines  Avoid eating more than 2,300 mg (milligrams) of salt (sodium) a day. If you have hypertension, you may need to reduce your sodium intake to 1,500 mg a day.  Limit alcohol intake to no more than 1 drink a day for nonpregnant women and 2 drinks a day for men. One drink equals 12 oz of beer, 5 oz of wine, or 1 oz of hard liquor.  Work with your health care provider to maintain a healthy body weight or to lose weight. Ask what an ideal weight is for you.  Get at least 30 minutes of exercise that causes your heart to beat faster (aerobic exercise) most days of the week. Activities may include walking, swimming, or biking.  Work with your health care provider or diet and nutrition specialist (dietitian) to adjust your eating plan to your individual calorie needs. Reading food labels  Check food labels for the amount of sodium per serving. Choose foods with less than 5 percent of the Daily Value of sodium. Generally, foods with less than 300 mg of sodium per serving fit into this eating plan.  To find whole grains, look for the word "whole" as the first word in the ingredient list. Shopping  Buy products labeled as "low-sodium" or "no salt added."  Buy fresh foods. Avoid canned foods and premade or frozen meals. Cooking  Avoid adding salt when cooking. Use salt-free seasonings or herbs instead of table salt or sea salt. Check with your health care provider or pharmacist before using salt substitutes.  Do not fry foods. Cook foods using healthy methods such as baking, boiling, grilling, and broiling instead.  Cook with  heart-healthy oils, such as olive, canola, soybean, or sunflower oil. Meal planning   Eat a balanced diet that includes: ? 5 or more servings of fruits and vegetables each day. At each meal, try to fill half of your plate with fruits and vegetables. ? Up to 6-8 servings of whole grains each day. ? Less than 6 oz of lean meat, poultry, or fish each day. A 3-oz serving of meat is about the same size as a deck of cards. One egg equals 1 oz. ? 2 servings of low-fat dairy each day. ? A serving of nuts, seeds, or beans 5 times each week. ? Heart-healthy fats. Healthy fats called Omega-3 fatty acids are found in foods such as flaxseeds and coldwater fish, like sardines, salmon, and mackerel.  Limit how much you eat of the following: ? Canned or prepackaged foods. ? Food that is high in trans fat, such as fried foods. ? Food that is high in saturated fat, such as fatty meat. ? Sweets, desserts, sugary drinks, and other foods with added sugar. ? Full-fat dairy products.  Do not salt foods before eating.  Try to eat at least 2 vegetarian meals each week.  Eat more home-cooked food and less restaurant, buffet, and fast food.  When eating at a restaurant, ask that your food be prepared with less salt or no salt, if possible. What foods are recommended? The items listed may not be a complete list. Talk with your dietitian about what   dietary choices are best for you. Grains Whole-grain or whole-wheat bread. Whole-grain or whole-wheat pasta. Brown rice. Oatmeal. Quinoa. Bulgur. Whole-grain and low-sodium cereals. Pita bread. Low-fat, low-sodium crackers. Whole-wheat flour tortillas. Vegetables Fresh or frozen vegetables (raw, steamed, roasted, or grilled). Low-sodium or reduced-sodium tomato and vegetable juice. Low-sodium or reduced-sodium tomato sauce and tomato paste. Low-sodium or reduced-sodium canned vegetables. Fruits All fresh, dried, or frozen fruit. Canned fruit in natural juice (without  added sugar). Meat and other protein foods Skinless chicken or turkey. Ground chicken or turkey. Pork with fat trimmed off. Fish and seafood. Egg whites. Dried beans, peas, or lentils. Unsalted nuts, nut butters, and seeds. Unsalted canned beans. Lean cuts of beef with fat trimmed off. Low-sodium, lean deli meat. Dairy Low-fat (1%) or fat-free (skim) milk. Fat-free, low-fat, or reduced-fat cheeses. Nonfat, low-sodium ricotta or cottage cheese. Low-fat or nonfat yogurt. Low-fat, low-sodium cheese. Fats and oils Soft margarine without trans fats. Vegetable oil. Low-fat, reduced-fat, or light mayonnaise and salad dressings (reduced-sodium). Canola, safflower, olive, soybean, and sunflower oils. Avocado. Seasoning and other foods Herbs. Spices. Seasoning mixes without salt. Unsalted popcorn and pretzels. Fat-free sweets. What foods are not recommended? The items listed may not be a complete list. Talk with your dietitian about what dietary choices are best for you. Grains Baked goods made with fat, such as croissants, muffins, or some breads. Dry pasta or rice meal packs. Vegetables Creamed or fried vegetables. Vegetables in a cheese sauce. Regular canned vegetables (not low-sodium or reduced-sodium). Regular canned tomato sauce and paste (not low-sodium or reduced-sodium). Regular tomato and vegetable juice (not low-sodium or reduced-sodium). Pickles. Olives. Fruits Canned fruit in a light or heavy syrup. Fried fruit. Fruit in cream or butter sauce. Meat and other protein foods Fatty cuts of meat. Ribs. Fried meat. Bacon. Sausage. Bologna and other processed lunch meats. Salami. Fatback. Hotdogs. Bratwurst. Salted nuts and seeds. Canned beans with added salt. Canned or smoked fish. Whole eggs or egg yolks. Chicken or turkey with skin. Dairy Whole or 2% milk, cream, and half-and-half. Whole or full-fat cream cheese. Whole-fat or sweetened yogurt. Full-fat cheese. Nondairy creamers. Whipped toppings.  Processed cheese and cheese spreads. Fats and oils Butter. Stick margarine. Lard. Shortening. Ghee. Bacon fat. Tropical oils, such as coconut, palm kernel, or palm oil. Seasoning and other foods Salted popcorn and pretzels. Onion salt, garlic salt, seasoned salt, table salt, and sea salt. Worcestershire sauce. Tartar sauce. Barbecue sauce. Teriyaki sauce. Soy sauce, including reduced-sodium. Steak sauce. Canned and packaged gravies. Fish sauce. Oyster sauce. Cocktail sauce. Horseradish that you find on the shelf. Ketchup. Mustard. Meat flavorings and tenderizers. Bouillon cubes. Hot sauce and Tabasco sauce. Premade or packaged marinades. Premade or packaged taco seasonings. Relishes. Regular salad dressings. Where to find more information:  National Heart, Lung, and Blood Institute: www.nhlbi.nih.gov  American Heart Association: www.heart.org Summary  The DASH eating plan is a healthy eating plan that has been shown to reduce high blood pressure (hypertension). It may also reduce your risk for type 2 diabetes, heart disease, and stroke.  With the DASH eating plan, you should limit salt (sodium) intake to 2,300 mg a day. If you have hypertension, you may need to reduce your sodium intake to 1,500 mg a day.  When on the DASH eating plan, aim to eat more fresh fruits and vegetables, whole grains, lean proteins, low-fat dairy, and heart-healthy fats.  Work with your health care provider or diet and nutrition specialist (dietitian) to adjust your eating plan to your individual   calorie needs. This information is not intended to replace advice given to you by your health care provider. Make sure you discuss any questions you have with your health care provider. Document Released: 05/12/2011 Document Revised: 05/16/2016 Document Reviewed: 05/16/2016 Elsevier Interactive Patient Education  2018 Elsevier Inc.  

## 2017-10-17 NOTE — Progress Notes (Signed)
   Subjective:    Patient ID: Rebecca Wolfe, female    DOB: 1946/02/01, 72 y.o.   MRN: [id number]   Chief Complaint: Medical Management of Chronic Issues   HPI:  1. Essential hypertension  No c/o chest pain . Sob or headache. Does not check blood pressure at home. BP Readings from Last 3 Encounters:  10/17/17 122/70  04/18/17 137/77  11/10/16 138/68     2. Mixed hyperlipidemia  Patient watches diet and really does not eat a lot.    Outpatient Encounter Medications as of 10/17/2017  Medication Sig  . atorvastatin (LIPITOR) 40 MG tablet Take 1 tablet (40 mg total) by mouth daily.  Marland Kitchen lisinopril-hydrochlorothiazide (PRINZIDE,ZESTORETIC) 20-12.5 MG tablet Take 2 tablets daily by mouth.      New complaints: None today  Social history: Lives alone. Her daughters check on her daily    Review of Systems  Constitutional: Negative for activity change and appetite change.  HENT: Negative.   Eyes: Negative for pain.  Respiratory: Negative for shortness of breath.   Cardiovascular: Negative for chest pain, palpitations and leg swelling.  Gastrointestinal: Negative for abdominal pain.  Endocrine: Negative for polydipsia.  Genitourinary: Negative.   Skin: Negative for rash.  Neurological: Negative for dizziness, weakness and headaches.  Hematological: Does not bruise/bleed easily.  Psychiatric/Behavioral: Negative.   All other systems reviewed and are negative.      Objective:   Physical Exam  Constitutional: She is oriented to person, place, and time.  HENT:  Head: Normocephalic.  Nose: Nose normal.  Mouth/Throat: Oropharynx is clear and moist.  Eyes: Pupils are equal, round, and reactive to light. EOM are normal.  Neck: Normal range of motion. Neck supple. No JVD present. Carotid bruit is not present.  Cardiovascular: Normal rate, regular rhythm, normal heart sounds and intact distal pulses.  Pulmonary/Chest: Effort normal and breath sounds normal. No respiratory  distress. She has no wheezes. She has no rales. She exhibits no tenderness.  Abdominal: Soft. Normal appearance, normal aorta and bowel sounds are normal. She exhibits no distension, no abdominal bruit, no pulsatile midline mass and no mass. There is no splenomegaly or hepatomegaly. There is no tenderness.  Musculoskeletal: Normal range of motion. She exhibits no edema.  Lymphadenopathy:    She has no cervical adenopathy.  Neurological: She is alert and oriented to person, place, and time. She has normal reflexes.  Skin: Skin is warm and dry.  Psychiatric: She has a normal mood and affect. Her behavior is normal. Judgment and thought content normal.    BP 122/70   Pulse 70   Temp (!) 96.8 F (36 C) (Oral)   Ht  (1.676 m)   Wt 139 lb (63 kg)   BMI 22.44 kg/m       Assessment & Plan:  Rebecca Wolfe comes in today with chief complaint of Medical Management of Chronic Issues   Diagnosis and orders addressed:  1. Essential hypertension Low sodium diet - lisinopril-hydrochlorothiazide (PRINZIDE,ZESTORETIC) 20-12.5 MG tablet; Take 2 tablets by mouth daily.  Dispense: 180 tablet; Refill: 1  2. Mixed hyperlipidemia Low fat diet - atorvastatin (LIPITOR) 40 MG tablet; Take 1 tablet (40 mg total) by mouth daily.  Dispense: 90 tablet; Refill: 1   Labs pending Health Maintenance reviewed Diet and exercise encouraged  Follow up plan: 6 months   Mary-Margaret Daphine Deutscher, FNP

## 2017-10-17 NOTE — Addendum Note (Signed)
Addended by: Cleda Daub on: 10/17/2017 09:47 AM   Modules accepted: Orders

## 2017-11-15 ENCOUNTER — Ambulatory Visit (INDEPENDENT_AMBULATORY_CARE_PROVIDER_SITE_OTHER): Payer: Medicare Other | Admitting: *Deleted

## 2017-11-15 ENCOUNTER — Encounter: Payer: Self-pay | Admitting: *Deleted

## 2017-11-15 VITALS — BP 127/58 | HR 68 | Ht 64.0 in | Wt 140.0 lb

## 2017-11-15 DIAGNOSIS — Z0000 Encounter for general adult medical examination without abnormal findings: Secondary | ICD-10-CM

## 2017-11-15 NOTE — Progress Notes (Addendum)
Subjective:   Rebecca Wolfe is a 72 y.o. female who presents for a Medicare Annual Wellness Visit. Rebecca Wolfe lives at home alone. She has 2 adult daughters and one adult son and one grandson that is in the [employer] and stationed [place].     Review of Systems    Patient reports that her health is unchanged compared to last year.  Cardiac Risk Factors include: advanced age (>6755men, 73>65 women)   Other systems negative       Current Medications (verified) Outpatient Encounter Medications as of 11/15/2017  Medication Sig  . acetaminophen (TYLENOL) 500 MG tablet Take 500 mg by mouth every 6 (six) hours as needed.  Marland Kitchen. atorvastatin (LIPITOR) 40 MG tablet Take 1 tablet (40 mg total) by mouth daily.  Marland Kitchen. lisinopril-hydrochlorothiazide (PRINZIDE,ZESTORETIC) 20-12.5 MG tablet Take 2 tablets by mouth daily.  Marland Kitchen. MAGNESIUM PO Take by mouth.   No facility-administered encounter medications on file as of 11/15/2017.     Allergies (verified) Patient has no known allergies.   History: Past Medical History:  Diagnosis Date  . Arthritis   . Bone spur of right foot    No past surgical history on file. Family History  Problem Relation Age of Onset  . Stroke Father   . Cancer Sister 1055       ovarian  . Cancer Sister        Breast   Social History   Socioeconomic History  . Marital status: Widowed    Spouse name: Not on file  . Number of children: 3  . Years of education: 10  . Highest education level: Not on file  Occupational History  . Occupation: Retired  Engineer, productionocial Needs  . Financial resource strain: Not hard at all  . Food insecurity:    Worry: Never true    Inability: Never true  . Transportation needs:    Medical: No    Non-medical: No  Tobacco Use  . Smoking status: Never Smoker  . Smokeless tobacco: Never Used  Substance and Sexual Activity  . Alcohol use: No  . Drug use: No  . Sexual activity: Not Currently  Lifestyle  . Physical activity:    Days per week: 7  days    Minutes per session: 60 min  . Stress: To some extent  Relationships  . Social connections:    Talks on phone: More than three times a week    Gets together: More than three times a week    Attends religious service: Never    Active member of club or organization: No    Attends meetings of clubs or organizations: Never    Relationship status: Widowed  Other Topics Concern  . Not on file  Social History Narrative  . Not on file    Tobacco Use No.  Clinical Intake:     Pain : No/denies pain     Nutritional Status: BMI of 19-24  Normal  How often do you need to have someone help you when you read instructions, pamphlets, or other written materials from your doctor or pharmacy?: 1 - Never What is the last grade level you completed in school?: 10th  Interpreter Needed?: No  Information entered by :: Demetrios LollKristen Hudy, RN   Activities of Daily Living In your present state of health, do you have any difficulty performing the following activities: 11/15/2017  Hearing? N  Vision? N  Comment does not want to have an eye exam  Difficulty concentrating or making  decisions? Y  Comment Has noticed some decrease in memory  Walking or climbing stairs? N  Dressing or bathing? N  Doing errands, shopping? N  Preparing Food and eating ? N  Using the Toilet? N  In the past six months, have you accidently leaked urine? N  Do you have problems with loss of bowel control? N  Managing your Medications? N  Managing your Finances? N  Housekeeping or managing your Housekeeping? N  Some recent data might be hidden     Immunizations and Health Maintenance Immunization History  Administered Date(s) Administered  . Influenza, High Dose Seasonal PF 04/18/2017  . Influenza,inj,Quad PF,6+ Mos 04/24/2013, 04/25/2013, 06/30/2014, 03/31/2015   There are no preventive care reminders to display for this patient.  Diet Eats small, "healthy" portions. Eats 3 times a day but may not be a  full meal. Drinks some water but not enough. She does drink quite a bit of coffee.    Exercise Current Exercise Habits: Home exercise routine, Type of exercise: walking, Time (Minutes): 60, Frequency (Times/Week): 7, Weekly Exercise (Minutes/Week): 420, Intensity: Mild, Exercise limited by: orthopedic condition(s)(bone spur)   Depression Screen PHQ 2/9 Scores 11/15/2017 10/17/2017 04/18/2017 11/10/2016 08/03/2016 02/02/2016 10/30/2015  PHQ - 2 Score 0 0 0 0 0 0 0     Fall Risk Fall Risk  11/15/2017 10/17/2017 04/18/2017 11/10/2016 08/03/2016  Falls in the past year? No No No No No  Risk for fall due to : - - - - -    Safety Is the patient's home free of loose throw rugs in walkways, pet beds, electrical cords, etc?   yes      Grab bars in the bathroom? no      Walkin shower? no      Shower Seat? no      Handrails on the stairs?   yes      Adequate lighting?   yes  Patient Care Team: Bennie Pierini, FNP as PCP - General (Nurse Practitioner)   No hospitalizations, ER visits, or surgeries this past year.  Objective:    Today's Vitals   11/15/17 1530  BP: (!) 127/58  Pulse: 68  Weight: 140 lb (63.5 kg)  Height: 5\' 4"  (1.626 m)   Body mass index is 24.03 kg/m.  Hearing/Vision  normal or No deficits noted during visit.  Cognitive Function:    MMSE - Mini Mental State Exam 11/15/2017 11/15/2017  Not completed: - Unable to complete  Orientation to time 5 5  Orientation to Place 5 5  Registration 3 3  Attention/ Calculation - 0  Attention/Calculation-comments - refused  Recall 3 3  Language- name 2 objects 2 2  Language- repeat 1 1  Language- follow 3 step command 3 3  Language- read & follow direction 1 1  Write a sentence 1 1  Copy design 1 1  Total score - 25      Normal Cognitive Function Screening: Likely normal based on responses given and the rest of the visit.     Assessment:   This is a routine wellness examination for Rebecca Wolfe.    Plan:    Goals    .  DIET - INCREASE WATER INTAKE     Increase water intake to 6 glasses of water a day       Keep f/u with Bennie Pierini, FNP and any other specialty appointments you may have Continue current medications Move carefully to avoid falls. Use assistive devices like a can or walker if  needed. Aim for at least 150 minutes of moderate activity a week. This can be done with chair exercises if necessary. Read or work on puzzles daily Stay connected with friends and family  Review and return a signed, witnessed, and notarized copy of Advance Directives if given.  Health Maintenance: Tdap Vaccine recommended: yes Zostavax (Shingles vaccine) recommended:yes Prevnar or Pneumovax (pneumonia vaccines) recommended:yes  Cancer Screenings: Lung: Low Dose CT Chest recommended if Age 23-80 years, 30 pack-year currently smoking OR have quit w/in 15years. Patient does not qualify. Colon cancer screening recommended: yes Mammogram recommended:yes Pap Smear recommended: no  Additional Screenings Hepatitis C Screening recommended: no Dexa Scan recommended: yes Diabetic Eye Exam recommended: no  No orders of the defined types were placed in this encounter.   I have personally reviewed and noted the following in the patient's chart:   . Medical and social history . Use of alcohol, tobacco or illicit drugs  . Current medications and supplements . Functional ability and status . Nutritional status . Physical activity . Advanced directives . List of other physicians . Hospitalizations, surgeries, and ER visits in previous 12 months . Vitals . Screenings to include cognitive, depression, and falls . Referrals and appointments  In addition, I have reviewed and discussed with patient certain preventive protocols, quality metrics, and best practice recommendations. A written personalized care plan for preventive services as well as general preventive health recommendations were provided to  patient.     Demetrios Loll, RN   11/15/2017   I have reviewed and agree with the above AWV documentation.   Mary-Margaret Daphine Deutscher, FNP

## 2017-11-24 ENCOUNTER — Ambulatory Visit (INDEPENDENT_AMBULATORY_CARE_PROVIDER_SITE_OTHER): Payer: Medicare Other

## 2017-11-24 ENCOUNTER — Ambulatory Visit (INDEPENDENT_AMBULATORY_CARE_PROVIDER_SITE_OTHER): Payer: Medicare Other | Admitting: Family Medicine

## 2017-11-24 ENCOUNTER — Encounter: Payer: Self-pay | Admitting: Family Medicine

## 2017-11-24 VITALS — BP 151/74 | HR 84 | Temp 96.8°F | Ht 64.0 in | Wt 141.0 lb

## 2017-11-24 DIAGNOSIS — R221 Localized swelling, mass and lump, neck: Secondary | ICD-10-CM | POA: Diagnosis not present

## 2017-11-24 DIAGNOSIS — J392 Other diseases of pharynx: Secondary | ICD-10-CM | POA: Diagnosis not present

## 2017-11-24 DIAGNOSIS — K30 Functional dyspepsia: Secondary | ICD-10-CM

## 2017-11-24 MED ORDER — GI COCKTAIL ~~LOC~~: 30.0000 mL | Freq: Once | ORAL | Status: DC

## 2017-11-24 NOTE — Progress Notes (Signed)
Subjective: CC: sore throat PCP: Bennie Pierini, FNP ZOX:WRUEAV C Raso is a 72 y.o. female presenting to clinic today for:  1. Sore throat Patient notes that last evening, she was eating an apple when she felt like it got stuck in her throat.  She started vomiting as a result.  She continues to have sensation of foreign body in her throat.  She notes that now her throat is very raw and irritated feeling.  She has been pushing oral fluids but has not had anything to eat.  She denies nausea, abdominal pain, fevers.  She does feel like she is having indigestion now.  She does not have a history of GERD.  No hematemesis, diarrhea.  She has not taken her medications today.  She has not used any medications to help with symptoms.   ROS: Per HPI  No Known Allergies Past Medical History:  Diagnosis Date  . Arthritis   . Bone spur of right foot     Current Outpatient Medications:  .  acetaminophen (TYLENOL) 500 MG tablet, Take 500 mg by mouth every 6 (six) hours as needed., Disp: , Rfl:  .  atorvastatin (LIPITOR) 40 MG tablet, Take 1 tablet (40 mg total) by mouth daily., Disp: 90 tablet, Rfl: 1 .  lisinopril-hydrochlorothiazide (PRINZIDE,ZESTORETIC) 20-12.5 MG tablet, Take 2 tablets by mouth daily., Disp: 180 tablet, Rfl: 1 .  MAGNESIUM PO, Take by mouth., Disp: , Rfl:  Social History   Socioeconomic History  . Marital status: Widowed    Spouse name: Not on file  . Number of children: 3  . Years of education: 10  . Highest education level: Not on file  Occupational History  . Occupation: Retired  Engineer, production  . Financial resource strain: Not hard at all  . Food insecurity:    Worry: Never true    Inability: Never true  . Transportation needs:    Medical: No    Non-medical: No  Tobacco Use  . Smoking status: Never Smoker  . Smokeless tobacco: Never Used  Substance and Sexual Activity  . Alcohol use: No  . Drug use: No  . Sexual activity: Not Currently  Lifestyle  .  Physical activity:    Days per week: 7 days    Minutes per session: 60 min  . Stress: To some extent  Relationships  . Social connections:    Talks on phone: More than three times a week    Gets together: More than three times a week    Attends religious service: Never    Active member of club or organization: No    Attends meetings of clubs or organizations: Never    Relationship status: Widowed  . Intimate partner violence:    Fear of current or ex partner: No    Emotionally abused: No    Physically abused: No    Forced sexual activity: Not on file  Other Topics Concern  . Not on file  Social History Narrative  . Not on file   Family History  Problem Relation Age of Onset  . Stroke Father   . Cancer Sister 55       ovarian  . Cancer Sister        Breast    Objective: Office vital signs reviewed. BP (!) 151/74   Pulse 84   Temp (!) 96.8 F (36 C) (Oral)   Ht 5\' 4"  (1.626 m)   Wt 141 lb (64 kg)   BMI 24.20 kg/m  Physical Examination:  General: Awake, alert, well nourished, anxious appearing HEENT: Normal    Neck: No masses palpated. No lymphadenopathy     Eyes: PERRLA, extraocular membranes intact, sclera white    Throat: moist mucus membranes, mild oropharyngeal erythema Airway is patent. Mallampati 1. No visible foreign body.   Cardio: regular rate  Pulm: clear to auscultation bilaterally, no wheezes, rhonchi or rales; normal work of breathing on room air GI: soft, non-tender, non-distended, bowel sounds present x4, no hepatomegaly, no splenomegaly, no masses  Dg Neck Soft Tissue  Result Date: 11/24/2017 CLINICAL DATA:  Swallowed apple last night. Feels like it is stuck in the throat. EXAM: NECK SOFT TISSUES - 1+ VIEW COMPARISON:  None. FINDINGS: There is no evidence of retropharyngeal soft tissue swelling or epiglottic enlargement. The cervical airway is unremarkable and no radio-opaque foreign body identified. IMPRESSION: Negative. Electronically Signed    By: Elige KoHetal  Patel   On: 11/24/2017 09:08   Assessment/ Plan: 72 y.o. female   1. Throat irritation Persistent globus sensation and throat irritation after vomiting.  She is having indigestion symptoms as well.  I suspect that globus sensation is likely related to vomiting and acid.  She was given a dose of GI cocktail here in office.  She did have some improvement in the irritation but was still concerned about foreign body.  For this reason, we proceeded with soft tissue x-ray to evaluate for any evidence of retained foreign body.  Personal review of x-ray and radiologist review of x-ray did not appreciate any retained radiopaque foreign bodies.  Patient seemed quite relieved by this information.  A copy of her x-ray report was provided.  Home care instructions were reviewed.  Reasons for return and emergent evaluation emergency department discussed with the patient and her family member.  They voiced understanding will follow-up as needed. - DG Neck Soft Tissue; Future  2. Indigestion - gi cocktail (Maalox,Lidocaine,Donnatal)   Orders Placed This Encounter  Procedures  . DG Neck Soft Tissue    Standing Status:   Future    Number of Occurrences:   1    Standing Expiration Date:   01/25/2019    Order Specific Question:   Reason for Exam (SYMPTOM  OR DIAGNOSIS REQUIRED)    Answer:   foreign body sensation after getting apple caught in throat.   has been vomiting since last evening. continue throat pain.    Order Specific Question:   Preferred imaging location?    Answer:   Internal    Order Specific Question:   Radiology Contrast Protocol - do NOT remove file path    Answer:   [email or web address]   Meds ordered this encounter  Medications  . gi cocktail (Maalox,Lidocaine,Donnatal)     Raliegh IpAshly M Gottschalk, DO [facility] [phone]

## 2017-11-24 NOTE — Patient Instructions (Signed)
Continue to drink plenty of fluids.  Consider drinking tea with honey.  You can use honey to coat the throat and sooth the irritation.  If needed, consider using Tums today to help with the indigestion component.  You may use a sore throat spray.  If your symptoms of foreign body in the throat return or you are unable to keep fluids down, please seek medical attention.   Globus Pharyngeus Globus pharyngeus is a condition that makes it feel like you have a lump in your throat. It may also feel like you have something stuck in the front of your throat. This feeling may come and go. It is not painful, and it does not make it harder to swallow food or liquid. Globus pharyngeus does not cause changes that a health care provider can see during a physical exam. This condition usually goes away without treatment. What are the causes? Often, no cause can be found. The most common cause of globus pharyngeus is a condition that causes stomach juices to flow back up into the throat (gastroesophageal reflux). Other possible causes include:  Overstimulation of nerves that control swallowing.  Irritation of nerves that control swallowing (neuralgia).  An enlarged gland in the lower neck (thyroid gland).  Growth of tonsil tissue at the base of the tongue (lingual tonsil).  Anxiety.  Depression.  What are the signs or symptoms? The main symptom of this condition is a feeling of a lump in your throat. This feeling usually comes and goes. How is this diagnosed? This condition may be diagnosed after other conditions have been ruled out. You may have tests, such as:  A swallow study.  Ear, nose, and throat evaluation.  An exam of your throat using a thin, flexible tube with a light and camera on the end (endoscopy).  How is this treated? This condition may go away on its own, without treatment. In some cases, antidepressant medicines may be helpful. Follow these instructions at home:  Follow  instructions from your health care provider about eating or drinking restrictions.  Take over-the-counter and prescription medicines only as told by your health care provider.  Keep all follow-up visits as told by your health care provider. This is important.  Follow instructions from your health care provider about home care for gastroesophageal reflux. Your health care provider may recommend that you: ? Do not eat or drink anything that causes heartburn. ? Do not eat heavy meals close to bedtime. ? Do not drink caffeine. ? Do not drink alcohol. ? Raise the head of your bed. ? Sleep on your left side. Contact a health care provider if:  Your symptoms get worse.  You have throat pain.  You have trouble swallowing.  Food or liquid comes back up into your mouth.  You lose weight without trying. Get help right away if:  You develop swelling in your throat. Summary  Globus pharyngeus is a condition that makes it feel like you have a lump in your throat.  This condition usually goes away without treatment. This information is not intended to replace advice given to you by your health care provider. Make sure you discuss any questions you have with your health care provider. Document Released: 01/27/2016 Document Revised: 01/27/2016 Document Reviewed: 01/27/2016 Elsevier Interactive Patient Education  [employer]2018 Elsevier Inc.

## 2018-01-29 ENCOUNTER — Ambulatory Visit: Payer: Self-pay | Admitting: Nurse Practitioner

## 2018-04-05 ENCOUNTER — Other Ambulatory Visit: Payer: Self-pay | Admitting: Nurse Practitioner

## 2018-04-05 DIAGNOSIS — I10 Essential (primary) hypertension: Secondary | ICD-10-CM

## 2018-06-13 ENCOUNTER — Other Ambulatory Visit: Payer: Self-pay | Admitting: Nurse Practitioner

## 2018-06-13 DIAGNOSIS — E782 Mixed hyperlipidemia: Secondary | ICD-10-CM

## 2018-07-08 ENCOUNTER — Other Ambulatory Visit: Payer: Self-pay | Admitting: Nurse Practitioner

## 2018-07-08 DIAGNOSIS — E782 Mixed hyperlipidemia: Secondary | ICD-10-CM

## 2018-07-09 NOTE — Telephone Encounter (Signed)
Last lipid 10/17/17 

## 2018-07-12 ENCOUNTER — Other Ambulatory Visit: Payer: Self-pay | Admitting: Nurse Practitioner

## 2018-07-12 DIAGNOSIS — E782 Mixed hyperlipidemia: Secondary | ICD-10-CM

## 2018-07-13 NOTE — Telephone Encounter (Signed)
Last lipid 10/17/17 

## 2018-07-16 ENCOUNTER — Other Ambulatory Visit: Payer: Self-pay | Admitting: Nurse Practitioner

## 2018-07-16 DIAGNOSIS — E782 Mixed hyperlipidemia: Secondary | ICD-10-CM

## 2018-08-06 ENCOUNTER — Other Ambulatory Visit: Payer: Self-pay | Admitting: Nurse Practitioner

## 2018-08-06 DIAGNOSIS — E782 Mixed hyperlipidemia: Secondary | ICD-10-CM

## 2018-08-07 NOTE — Telephone Encounter (Signed)
Last lipid 10/17/17

## 2018-08-09 ENCOUNTER — Other Ambulatory Visit: Payer: Self-pay | Admitting: Nurse Practitioner

## 2018-08-09 DIAGNOSIS — E782 Mixed hyperlipidemia: Secondary | ICD-10-CM

## 2018-08-10 ENCOUNTER — Other Ambulatory Visit: Payer: Self-pay | Admitting: Nurse Practitioner

## 2018-08-10 DIAGNOSIS — E782 Mixed hyperlipidemia: Secondary | ICD-10-CM

## 2018-08-20 ENCOUNTER — Other Ambulatory Visit: Payer: Self-pay

## 2018-08-20 ENCOUNTER — Ambulatory Visit (INDEPENDENT_AMBULATORY_CARE_PROVIDER_SITE_OTHER): Payer: Medicare Other | Admitting: Nurse Practitioner

## 2018-08-20 ENCOUNTER — Encounter: Payer: Self-pay | Admitting: Nurse Practitioner

## 2018-08-20 VITALS — BP 144/75 | HR 73 | Temp 96.8°F | Ht 64.0 in | Wt 135.0 lb

## 2018-08-20 DIAGNOSIS — I10 Essential (primary) hypertension: Secondary | ICD-10-CM | POA: Diagnosis not present

## 2018-08-20 DIAGNOSIS — E782 Mixed hyperlipidemia: Secondary | ICD-10-CM | POA: Diagnosis not present

## 2018-08-20 LAB — CMP14+EGFR
ALT: 22 IU/L (ref 0–32)
AST: 22 IU/L (ref 0–40)
Albumin/Globulin Ratio: 2.2 (ref 1.2–2.2)
Albumin: 4.7 g/dL (ref 3.7–4.7)
Alkaline Phosphatase: 59 IU/L (ref 39–117)
BILIRUBIN TOTAL: 0.3 mg/dL (ref 0.0–1.2)
BUN / CREAT RATIO: 6 — AB (ref 12–28)
BUN: 6 mg/dL — ABNORMAL LOW (ref 8–27)
CHLORIDE: 99 mmol/L (ref 96–106)
CO2: 24 mmol/L (ref 20–29)
Calcium: 10 mg/dL (ref 8.7–10.3)
Creatinine, Ser: 1.02 mg/dL — ABNORMAL HIGH (ref 0.57–1.00)
GFR calc non Af Amer: 54 mL/min/{1.73_m2} — ABNORMAL LOW (ref >59)
GFR, EST AFRICAN AMERICAN: 63 mL/min/{1.73_m2} (ref >59)
Globulin, Total: 2.1 g/dL (ref 1.5–4.5)
Glucose: 94 mg/dL (ref 65–99)
Potassium: 4.5 mmol/L (ref 3.5–5.2)
Sodium: 140 mmol/L (ref 134–144)
TOTAL PROTEIN: 6.8 g/dL (ref 6.0–8.5)

## 2018-08-20 LAB — LIPID PANEL
Chol/HDL Ratio: 2.3 ratio (ref 0.0–4.4)
Cholesterol, Total: 135 mg/dL (ref 100–199)
HDL: 58 mg/dL (ref >39)
LDL Calculated: 64 mg/dL (ref 0–99)
Triglycerides: 64 mg/dL (ref 0–149)
VLDL Cholesterol Cal: 13 mg/dL (ref 5–40)

## 2018-08-20 MED ORDER — ATORVASTATIN CALCIUM 40 MG PO TABS: ORAL_TABLET | ORAL | 1 refills | Status: DC

## 2018-08-20 MED ORDER — LISINOPRIL-HYDROCHLOROTHIAZIDE 20-12.5 MG PO TABS: 2.0000 | ORAL_TABLET | Freq: Every day | ORAL | 1 refills | Status: DC

## 2018-08-20 NOTE — Progress Notes (Signed)
Subjective:    Patient ID: Rebecca Wolfe, female    DOB: 15-Aug-1945, 73 y.o.   MRN: [id number]   Chief Complaint: Medical Management of Chronic Issues   HPI:  1. Essential hypertension  No c/o chest pain, sob or headache. Does not check blood pressure at home. BP Readings from Last 3 Encounters:  08/20/18 (!) 144/75  11/24/17 (!) 151/74  11/15/17 (!) 127/58     2. Mixed hyperlipidemia  Does not watch diet and does no exercise.    Outpatient Encounter Medications as of 08/20/2018  Medication Sig  . acetaminophen (TYLENOL) 500 MG tablet Take 500 mg by mouth every 6 (six) hours as needed.  Marland Kitchen atorvastatin (LIPITOR) 40 MG tablet TAKE 1 TABLET BY MOUTH DAILY. NEEDS TO BE SEEN BEFORE NEXT REFILL  . lisinopril-hydrochlorothiazide (PRINZIDE,ZESTORETIC) 20-12.5 MG tablet TAKE 2 TABLETS DAILY BY MOUTH.      New complaints: None today  Social history: Patient lives alone   Review of Systems  Constitutional: Negative for activity change and appetite change.  HENT: Negative.   Eyes: Negative for pain.  Respiratory: Negative for shortness of breath.   Cardiovascular: Negative for chest pain, palpitations and leg swelling.  Gastrointestinal: Negative for abdominal pain.  Endocrine: Negative for polydipsia.  Genitourinary: Negative.   Skin: Negative for rash.  Neurological: Negative for dizziness, weakness and headaches.  Hematological: Does not bruise/bleed easily.  Psychiatric/Behavioral: Negative.   All other systems reviewed and are negative.      Objective:   Physical Exam Vitals signs and nursing note reviewed.  Constitutional:      General: She is not in acute distress.    Appearance: Normal appearance. She is well-developed.  HENT:     Head: Normocephalic.     Nose: Nose normal.  Eyes:     Pupils: Pupils are equal, round, and reactive to light.  Neck:     Musculoskeletal: Normal range of motion and neck supple.     Vascular: No carotid bruit or JVD.   Cardiovascular:     Rate and Rhythm: Normal rate and regular rhythm.     Heart sounds: Normal heart sounds.  Pulmonary:     Effort: Pulmonary effort is normal. No respiratory distress.     Breath sounds: Normal breath sounds. No wheezing or rales.  Chest:     Chest wall: No tenderness.  Abdominal:     General: Bowel sounds are normal. There is no distension or abdominal bruit.     Palpations: Abdomen is soft. There is no hepatomegaly, splenomegaly, mass or pulsatile mass.     Tenderness: There is no abdominal tenderness.  Musculoskeletal: Normal range of motion.  Lymphadenopathy:     Cervical: No cervical adenopathy.  Skin:    General: Skin is warm and dry.  Neurological:     Mental Status: She is alert and oriented to person, place, and time.     Deep Tendon Reflexes: Reflexes are normal and symmetric.  Psychiatric:        Behavior: Behavior normal.        Thought Content: Thought content normal.        Judgment: Judgment normal.    BP (!) 144/75   Pulse 73   Temp (!) 96.8 F (36 C) (Oral)   Ht '5\' 4"'  (1.626 m)   Wt 135 lb (61.2 kg)   BMI 23.17 kg/m       Assessment & Plan:  Rebecca Wolfe comes in today with chief complaint of  Medical Management of Chronic Issues   Diagnosis and orders addressed:  1. Essential hypertension Low sodium diet - CMP14+EGFR - lisinopril-hydrochlorothiazide (PRINZIDE,ZESTORETIC) 20-12.5 MG tablet; Take 2 tablets by mouth daily.  Dispense: 180 tablet; Refill: 1  2. Mixed hyperlipidemia Low fat diet - Lipid panel - atorvastatin (LIPITOR) 40 MG tablet; TAKE 1 TABLET BY MOUTH DAILY. NEEDS TO BE SEEN BEFORE NEXT REFILL  Dispense: 90 tablet; Refill: 1   Labs pending Health Maintenance reviewed Diet and exercise encouraged  Follow up plan: 6 months   Mary-Margaret Hassell Done, FNP

## 2018-08-20 NOTE — Patient Instructions (Signed)
Health Maintenance After Age 75 After age 75, you are at a higher risk for certain long-term diseases and infections as well as injuries from falls. Falls are a major cause of broken bones and head injuries in people who are older than age 75. Getting regular preventive care can help to keep you healthy and well. Preventive care includes getting regular testing and making lifestyle changes as recommended by your health care provider. Talk with your health care provider about:  Which screenings and tests you should have. A screening is a test that checks for a disease when you have no symptoms.  A diet and exercise plan that is right for you. What should I know about screenings and tests to prevent falls? Screening and testing are the best ways to find a health problem early. Early diagnosis and treatment give you the best chance of managing medical conditions that are common after age 75. Certain conditions and lifestyle choices may make you more likely to have a fall. Your health care provider may recommend:  Regular vision checks. Poor vision and conditions such as cataracts can make you more likely to have a fall. If you wear glasses, make sure to get your prescription updated if your vision changes.  Medicine review. Work with your health care provider to regularly review all of the medicines you are taking, including over-the-counter medicines. Ask your health care provider about any side effects that may make you more likely to have a fall. Tell your health care provider if any medicines that you take make you feel dizzy or sleepy.  Osteoporosis screening. Osteoporosis is a condition that causes the bones to get weaker. This can make the bones weak and cause them to break more easily.  Blood pressure screening. Blood pressure changes and medicines to control blood pressure can make you feel dizzy.  Strength and balance checks. Your health care provider may recommend certain tests to check your  strength and balance while standing, walking, or changing positions.  Foot health exam. Foot pain and numbness, as well as not wearing proper footwear, can make you more likely to have a fall.  Depression screening. You may be more likely to have a fall if you have a fear of falling, feel emotionally low, or feel unable to do activities that you used to do.  Alcohol use screening. Using too much alcohol can affect your balance and may make you more likely to have a fall. What actions can I take to lower my risk of falls? General instructions  Talk with your health care provider about your risks for falling. Tell your health care provider if: ? You fall. Be sure to tell your health care provider about all falls, even ones that seem minor. ? You feel dizzy, sleepy, or off-balance.  Take over-the-counter and prescription medicines only as told by your health care provider. These include any supplements.  Eat a healthy diet and maintain a healthy weight. A healthy diet includes low-fat dairy products, low-fat (lean) meats, and fiber from whole grains, beans, and lots of fruits and vegetables. Home safety  Remove any tripping hazards, such as rugs, cords, and clutter.  Install safety equipment such as grab bars in bathrooms and safety rails on stairs.  Keep rooms and walkways well-lit. Activity   Follow a regular exercise program to stay fit. This will help you maintain your balance. Ask your health care provider what types of exercise are appropriate for you.  If you need a cane or   walker, use it as recommended by your health care provider.  Wear supportive shoes that have nonskid soles. Lifestyle  Do not drink alcohol if your health care provider tells you not to drink.  If you drink alcohol, limit how much you have: ? 0-1 drink a day for women. ? 0-2 drinks a day for men.  Be aware of how much alcohol is in your drink. In the [place] one drink equals one typical bottle of beer (12  oz), one-half glass of wine (5 oz), or one shot of hard liquor (1 oz).  Do not use any products that contain nicotine or tobacco, such as cigarettes and e-cigarettes. If you need help quitting, ask your health care provider. Summary  Having a healthy lifestyle and getting preventive care can help to protect your health and wellness after age 75.  Screening and testing are the best way to find a health problem early and help you avoid having a fall. Early diagnosis and treatment give you the best chance for managing medical conditions that are more common for people who are older than age 75.  Falls are a major cause of broken bones and head injuries in people who are older than age 75. Take precautions to prevent a fall at home.  Work with your health care provider to learn what changes you can make to improve your health and wellness and to prevent falls. This information is not intended to replace advice given to you by your health care provider. Make sure you discuss any questions you have with your health care provider. Document Released: 04/05/2017 Document Revised: 04/05/2017 Document Reviewed: 04/05/2017 Elsevier Interactive Patient Education  2019 Elsevier Inc.  

## 2018-08-24 ENCOUNTER — Telehealth: Payer: Self-pay | Admitting: Nurse Practitioner

## 2018-08-24 NOTE — Telephone Encounter (Signed)
Aware of results. 

## 2019-01-01 ENCOUNTER — Telehealth: Payer: Self-pay | Admitting: Nurse Practitioner

## 2019-01-01 NOTE — Telephone Encounter (Signed)
Pt decided she didn't need an appt

## 2019-02-20 ENCOUNTER — Other Ambulatory Visit: Payer: Self-pay | Admitting: Nurse Practitioner

## 2019-02-20 DIAGNOSIS — E782 Mixed hyperlipidemia: Secondary | ICD-10-CM

## 2019-02-20 DIAGNOSIS — I10 Essential (primary) hypertension: Secondary | ICD-10-CM

## 2019-03-06 ENCOUNTER — Other Ambulatory Visit: Payer: Self-pay | Admitting: Nurse Practitioner

## 2019-03-06 DIAGNOSIS — E782 Mixed hyperlipidemia: Secondary | ICD-10-CM

## 2019-03-07 ENCOUNTER — Other Ambulatory Visit: Payer: Self-pay | Admitting: Nurse Practitioner

## 2019-03-07 DIAGNOSIS — I10 Essential (primary) hypertension: Secondary | ICD-10-CM

## 2019-04-02 ENCOUNTER — Other Ambulatory Visit: Payer: Self-pay

## 2019-04-02 ENCOUNTER — Encounter: Payer: Self-pay | Admitting: Nurse Practitioner

## 2019-04-02 ENCOUNTER — Ambulatory Visit (INDEPENDENT_AMBULATORY_CARE_PROVIDER_SITE_OTHER): Payer: Medicare Other | Admitting: Nurse Practitioner

## 2019-04-02 VITALS — BP 131/61 | HR 71 | Temp 99.1°F | Resp 20 | Ht 64.0 in | Wt 134.0 lb

## 2019-04-02 DIAGNOSIS — J301 Allergic rhinitis due to pollen: Secondary | ICD-10-CM

## 2019-04-02 DIAGNOSIS — Z23 Encounter for immunization: Secondary | ICD-10-CM

## 2019-04-02 DIAGNOSIS — I10 Essential (primary) hypertension: Secondary | ICD-10-CM

## 2019-04-02 DIAGNOSIS — E782 Mixed hyperlipidemia: Secondary | ICD-10-CM | POA: Diagnosis not present

## 2019-04-02 MED ORDER — LISINOPRIL-HYDROCHLOROTHIAZIDE 20-12.5 MG PO TABS: 2.0000 | ORAL_TABLET | Freq: Every day | ORAL | 1 refills | Status: DC

## 2019-04-02 MED ORDER — ATORVASTATIN CALCIUM 40 MG PO TABS: 40.0000 mg | ORAL_TABLET | Freq: Every day | ORAL | 1 refills | Status: DC

## 2019-04-02 MED ORDER — FLUTICASONE PROPIONATE 50 MCG/ACT NA SUSP: 2.0000 | Freq: Every day | NASAL | 6 refills | Status: DC

## 2019-04-02 NOTE — Patient Instructions (Signed)
Exercising to Stay Healthy To become healthy and stay healthy, it is recommended that you do moderate-intensity and vigorous-intensity exercise. You can tell that you are exercising at a moderate intensity if your heart starts beating faster and you start breathing faster but can still hold a conversation. You can tell that you are exercising at a vigorous intensity if you are breathing much harder and faster and cannot hold a conversation while exercising. Exercising regularly is important. It has many health benefits, such as:  Improving overall fitness, flexibility, and endurance.  Increasing bone density.  Helping with weight control.  Decreasing body fat.  Increasing muscle strength.  Reducing stress and tension.  Improving overall health. How often should I exercise? Choose an activity that you enjoy, and set realistic goals. Your health care provider can help you make an activity plan that works for you. Exercise regularly as told by your health care provider. This may include:  Doing strength training two times a week, such as: ? Lifting weights. ? Using resistance bands. ? Push-ups. ? Sit-ups. ? Yoga.  Doing a certain intensity of exercise for a given amount of time. Choose from these options: ? A total of 150 minutes of moderate-intensity exercise every week. ? A total of 75 minutes of vigorous-intensity exercise every week. ? A mix of moderate-intensity and vigorous-intensity exercise every week. Children, pregnant women, people who have not exercised regularly, people who are overweight, and older adults may need to talk with a health care provider about what activities are safe to do. If you have a medical condition, be sure to talk with your health care provider before you start a new exercise program. What are some exercise ideas? Moderate-intensity exercise ideas include:  Walking 1 mile (1.6 km) in about 15 minutes.  Biking.  Hiking.  Golfing.  Dancing.   Water aerobics. Vigorous-intensity exercise ideas include:  Walking 4.5 miles (7.2 km) or more in about 1 hour.  Jogging or running 5 miles (8 km) in about 1 hour.  Biking 10 miles (16.1 km) or more in about 1 hour.  Lap swimming.  Roller-skating or in-line skating.  Cross-country skiing.  Vigorous competitive sports, such as football, basketball, and soccer.  Jumping rope.  Aerobic dancing. What are some everyday activities that can help me to get exercise?  Yard work, such as: ? Pushing a lawn mower. ? Raking and bagging leaves.  Washing your car.  Pushing a stroller.  Shoveling snow.  Gardening.  Washing windows or floors. How can I be more active in my day-to-day activities?  Use stairs instead of an elevator.  Take a walk during your lunch break.  If you drive, park your car farther away from your work or school.  If you take public transportation, get off one stop early and walk the rest of the way.  Stand up or walk around during all of your indoor phone calls.  Get up, stretch, and walk around every 30 minutes throughout the day.  Enjoy exercise with a friend. Support to continue exercising will help you keep a regular routine of activity. What guidelines can I follow while exercising?  Before you start a new exercise program, talk with your health care provider.  Do not exercise so much that you hurt yourself, feel dizzy, or get very short of breath.  Wear comfortable clothes and wear shoes with good support.  Drink plenty of water while you exercise to prevent dehydration or heat stroke.  Work out until your breathing   and your heartbeat get faster. Where to find more information  [place] Department of Health and Human Services: www.hhs.gov  Centers for Disease Control and Prevention (CDC): www.cdc.gov Summary  Exercising regularly is important. It will improve your overall fitness, flexibility, and endurance.  Regular exercise also will  improve your overall health. It can help you control your weight, reduce stress, and improve your bone density.  Do not exercise so much that you hurt yourself, feel dizzy, or get very short of breath.  Before you start a new exercise program, talk with your health care provider. This information is not intended to replace advice given to you by your health care provider. Make sure you discuss any questions you have with your health care provider. Document Released: 06/25/2010 Document Revised: 05/05/2017 Document Reviewed: 04/13/2017 Elsevier Patient Education  2020 [employer] Inc.  

## 2019-04-02 NOTE — Progress Notes (Signed)
Subjective:    Patient ID: Rebecca Wolfe, female    DOB: Sep 12, 1945, 73 y.o.   MRN: [id number]   Chief Complaint: Medical Management of Chronic Issues    HPI:  1. Essential hypertension No c/o chest pain, sob or headache. Does not check blood pressure at home. BP Readings from Last 3 Encounters:  04/02/19 131/61  08/20/18 (!) 144/75  11/24/17 (!) 151/74     2. Mixed hyperlipidemia Tries to watch diet and does very little exercise. Lab Results  Component Value Date   CHOL 135 08/20/2018   HDL 58 08/20/2018   LDLCALC 64 08/20/2018   TRIG 64 08/20/2018   CHOLHDL 2.3 08/20/2018        Outpatient Encounter Medications as of 04/02/2019  Medication Sig  . acetaminophen (TYLENOL) 500 MG tablet Take 500 mg by mouth every 6 (six) hours as needed.  Marland Kitchen atorvastatin (LIPITOR) 40 MG tablet TAKE 1 TABLET BY MOUTH DAILY. (NEED TO MAKE 6 MOS SEPT APPT)  . lisinopril-hydrochlorothiazide (ZESTORETIC) 20-12.5 MG tablet TAKE 2 TABLETS BY MOUTH DAILY. (PLEASE MAKE 6 MOS SEPT APPT)     History reviewed. No pertinent surgical history.  Family History  Problem Relation Age of Onset  . Stroke Father   . Cancer Sister 47       ovarian  . Cancer Sister        Breast    New complaints: Has post nasal drip and sneezing  Social history: Lives by herself. Daughters check on her daily  Controlled substance contract: n/a    Review of Systems  Constitutional: Negative for activity change and appetite change.  HENT: Negative.   Eyes: Negative for pain.  Respiratory: Negative for shortness of breath.   Cardiovascular: Negative for chest pain, palpitations and leg swelling.  Gastrointestinal: Negative for abdominal pain.  Endocrine: Negative for polydipsia.  Genitourinary: Negative.   Skin: Negative for rash.  Neurological: Negative for dizziness, weakness and headaches.  Hematological: Does not bruise/bleed easily.  Psychiatric/Behavioral: Negative.   All other systems  reviewed and are negative.      Objective:   Physical Exam Vitals signs and nursing note reviewed.  Constitutional:      General: She is not in acute distress.    Appearance: Normal appearance. She is well-developed.  HENT:     Head: Normocephalic.     Nose: Nose normal.  Eyes:     Pupils: Pupils are equal, round, and reactive to light.  Neck:     Musculoskeletal: Normal range of motion and neck supple.     Vascular: No carotid bruit or JVD.  Cardiovascular:     Rate and Rhythm: Normal rate and regular rhythm.     Heart sounds: Normal heart sounds.  Pulmonary:     Effort: Pulmonary effort is normal. No respiratory distress.     Breath sounds: Normal breath sounds. No wheezing or rales.  Chest:     Chest wall: No tenderness.  Abdominal:     General: Bowel sounds are normal. There is no distension or abdominal bruit.     Palpations: Abdomen is soft. There is no hepatomegaly, splenomegaly, mass or pulsatile mass.     Tenderness: There is no abdominal tenderness.  Musculoskeletal: Normal range of motion.  Lymphadenopathy:     Cervical: No cervical adenopathy.  Skin:    General: Skin is warm and dry.  Neurological:     Mental Status: She is alert and oriented to person, place, and time.  Deep Tendon Reflexes: Reflexes are normal and symmetric.  Psychiatric:        Behavior: Behavior normal.        Thought Content: Thought content normal.        Judgment: Judgment normal.     BP 131/61   Pulse 71   Temp 99.1 F (37.3 C) (Temporal)   Resp 20   Ht '5\' 4"'  (1.626 m)   Wt 134 lb (60.8 kg)   SpO2 98%   BMI 23.00 kg/m        Assessment & Plan:  Rebecca Wolfe comes in today with chief complaint of Medical Management of Chronic Issues   Diagnosis and orders addressed:  1. Essential hypertension Low sodium diet - lisinopril-hydrochlorothiazide (ZESTORETIC) 20-12.5 MG tablet; Take 2 tablets by mouth daily. (Please make 6 mos Sept appt)  Dispense: 180 tablet;  Refill: 1 - CMP14+EGFR  2. Mixed hyperlipidemia Low fat diet - atorvastatin (LIPITOR) 40 MG tablet; Take 1 tablet (40 mg total) by mouth daily at 6 PM.  Dispense: 90 tablet; Refill: 1 - Lipid panel  3. Non-seasonal allergic rhinitis due to pollen Force fluids - fluticasone (FLONASE) 50 MCG/ACT nasal spray; Place 2 sprays into both nostrils daily.  Dispense: 16 g; Refill: 6   Labs pending Health Maintenance reviewed Diet and exercise encouraged  Follow up plan: 6 months   Mary-Margaret Hassell Done, FNP

## 2019-04-03 ENCOUNTER — Ambulatory Visit (INDEPENDENT_AMBULATORY_CARE_PROVIDER_SITE_OTHER): Payer: Medicare Other | Admitting: *Deleted

## 2019-04-03 DIAGNOSIS — Z0000 Encounter for general adult medical examination without abnormal findings: Secondary | ICD-10-CM | POA: Diagnosis not present

## 2019-04-03 LAB — CMP14+EGFR
ALT: 28 IU/L (ref 0–32)
AST: 24 IU/L (ref 0–40)
Albumin/Globulin Ratio: 2.3 — ABNORMAL HIGH (ref 1.2–2.2)
Albumin: 5 g/dL — ABNORMAL HIGH (ref 3.7–4.7)
Alkaline Phosphatase: 56 IU/L (ref 39–117)
BUN/Creatinine Ratio: 10 — ABNORMAL LOW (ref 12–28)
BUN: 9 mg/dL (ref 8–27)
Bilirubin Total: 0.3 mg/dL (ref 0.0–1.2)
CO2: 23 mmol/L (ref 20–29)
Calcium: 10.4 mg/dL — ABNORMAL HIGH (ref 8.7–10.3)
Chloride: 102 mmol/L (ref 96–106)
Creatinine, Ser: 0.89 mg/dL (ref 0.57–1.00)
GFR calc Af Amer: 73 mL/min/{1.73_m2} (ref >59)
GFR calc non Af Amer: 64 mL/min/{1.73_m2} (ref >59)
Globulin, Total: 2.2 g/dL (ref 1.5–4.5)
Glucose: 96 mg/dL (ref 65–99)
Potassium: 5.1 mmol/L (ref 3.5–5.2)
Sodium: 141 mmol/L (ref 134–144)
Total Protein: 7.2 g/dL (ref 6.0–8.5)

## 2019-04-03 LAB — LIPID PANEL
Chol/HDL Ratio: 2.3 ratio (ref 0.0–4.4)
Cholesterol, Total: 146 mg/dL (ref 100–199)
HDL: 63 mg/dL (ref >39)
LDL Chol Calc (NIH): 67 mg/dL (ref 0–99)
Triglycerides: 81 mg/dL (ref 0–149)
VLDL Cholesterol Cal: 16 mg/dL (ref 5–40)

## 2019-04-03 NOTE — Patient Instructions (Signed)
  Rebecca Wolfe , Thank you for taking time to come for your Medicare Wellness Visit. I appreciate your ongoing commitment to your health goals. Please review the following plan we discussed and let me know if I can assist you in the future.   These are the goals we discussed: Goals    . AWV Goals     04/03/2019 AWV Goal: Fall Prevention  . Over the next year, patient will decrease their risk for falls by: o Using assistive devices, such as a cane or walker, as needed o Identifying fall risks within their home and correcting them by: - Removing throw rugs - Adding handrails to stairs or ramps - Removing clutter and keeping a clear pathway throughout the home - Increasing light, especially at night - Adding shower handles/bars - Raising toilet seat o Identifying potential personal risk factors for falls: - Medication side effects - Incontinence/urgency - Vestibular dysfunction - Hearing loss - Musculoskeletal disorders - Neurological disorders - Orthostatic hypotension      . DIET - INCREASE WATER INTAKE     Increase water intake to 6 glasses of water a day       This is a list of the screening recommended for you and due dates:  Health Maintenance  Topic Date Due  . DEXA scan (bone density measurement)  04/01/2020*  . Colon Cancer Screening  04/01/2020*  . Pneumonia vaccines (2 of 2 - PCV13) 04/01/2020*  . Tetanus Vaccine  04/22/2019  . Flu Shot  Completed  .  Hepatitis C: One time screening is recommended by Center for Disease Control  (CDC) for  adults born from [birth year] through 1965.   Completed  *Topic was postponed. The date shown is not the original due date.

## 2019-04-03 NOTE — Progress Notes (Addendum)
MEDICARE ANNUAL WELLNESS VISIT  04/03/2019  Telephone Visit Disclaimer This Medicare AWV was conducted by telephone due to national recommendations for restrictions regarding the COVID-19 Pandemic (e.g. social distancing).  I verified, using two identifiers, that I am speaking with Rebecca CornerBrenda C Castor or their authorized healthcare agent. I discussed the limitations, risks, security, and privacy concerns of performing an evaluation and management service by telephone and the potential availability of an in-person appointment in the future. The patient expressed understanding and agreed to proceed.   Subjective:  Rebecca Wolfe is a 73 y.o. female patient of Bennie PieriniMartin, Mary-Margaret, FNP who had a Medicare Annual Wellness Visit today via telephone. Rebecca Wolfe is Retired and lives alone. she has 3 children. she reports that she is socially active and does interact with friends/family regularly. she is moderately physically active and enjoys studying her bible, listening to [employer] music, and watching gospel program.  Patient Care Team: Bennie PieriniMartin, Mary-Margaret, FNP as PCP - General (Nurse Practitioner)  Advanced Directives 04/03/2019  Does Patient Have a Medical Advance Directive? No  Would patient like information on creating a medical advance directive? No - Patient declined    Hospital Utilization Over the Past 12 Months: # of hospitalizations or ER visits: 0 # of surgeries: 0  Review of Systems    Patient reports that her overall health is better compared to last year.  History obtained from chart review and the patient  Patient Reported Readings (BP, Pulse, CBG, Weight, etc) none  Pain Assessment       Current Medications & Allergies (verified) Allergies as of 04/03/2019   No Known Allergies     Medication List       Accurate as of April 03, 2019  8:31 AM. If you have any questions, ask your nurse or doctor.        acetaminophen 500 MG tablet Commonly known as: TYLENOL  Take 500 mg by mouth every 6 (six) hours as needed.   atorvastatin 40 MG tablet Commonly known as: LIPITOR Take 1 tablet (40 mg total) by mouth daily at 6 PM.   fluticasone 50 MCG/ACT nasal spray Commonly known as: FLONASE Place 2 sprays into both nostrils daily.   lisinopril-hydrochlorothiazide 20-12.5 MG tablet Commonly known as: ZESTORETIC Take 2 tablets by mouth daily. (Please make 6 mos Sept appt)       History (reviewed): Past Medical History:  Diagnosis Date  . Arthritis   . Bone spur of right foot   . Hyperlipidemia   . Hypertension    History reviewed. No pertinent surgical history. Family History  Problem Relation Age of Onset  . Stroke Father   . Hypertension Father   . Hyperlipidemia Sister   . Hypertension Sister   . Cancer Sister 555       ovarian  . Cancer Sister        Breast   Social History   Socioeconomic History  . Marital status: Widowed    Spouse name: Not on file  . Number of children: 3  . Years of education: 10  . Highest education level: Not on file  Occupational History  . Occupation: Retired  Engineer, productionocial Needs  . Financial resource strain: Not hard at all  . Food insecurity    Worry: Never true    Inability: Never true  . Transportation needs    Medical: No    Non-medical: No  Tobacco Use  . Smoking status: Never Smoker  . Smokeless tobacco: Never Used  Substance and Sexual Activity  . Alcohol use: No  . Drug use: No  . Sexual activity: Not Currently  Lifestyle  . Physical activity    Days per week: 7 days    Minutes per session: 60 min  . Stress: Only a little  Relationships  . Social connections    Talks on phone: More than three times a week    Gets together: More than three times a week    Attends religious service: Never    Active member of club or organization: No    Attends meetings of clubs or organizations: Never    Relationship status: Widowed  Other Topics Concern  . Not on file  Social History Narrative   . Not on file    Activities of Daily Living In your present state of health, do you have any difficulty performing the following activities: 04/03/2019  Hearing? N  Vision? N  Difficulty concentrating or making decisions? Y  Comment Noticed that she gets forgetful at times  Walking or climbing stairs? N  Dressing or bathing? N  Doing errands, shopping? N  Preparing Food and eating ? N  Using the Toilet? N  In the past six months, have you accidently leaked urine? N  Do you have problems with loss of bowel control? N  Managing your Medications? N  Managing your Finances? N  Housekeeping or managing your Housekeeping? N  Some recent data might be hidden    Patient Education/ Literacy    Exercise Current Exercise Habits: Home exercise routine, Type of exercise: walking, Time (Minutes): 60, Frequency (Times/Week): 7, Weekly Exercise (Minutes/Week): 420, Intensity: Moderate, Exercise limited by: None identified  Diet Patient reports consuming 2 meals a day and 1 snack(s) a day Patient reports that her primary diet is: Regular Patient reports that she does have regular access to food.   Depression Screen PHQ 2/9 Scores 04/03/2019 04/02/2019 08/20/2018 11/24/2017 11/15/2017 10/17/2017 04/18/2017  PHQ - 2 Score 0 0 0 0 0 0 0     Fall Risk Fall Risk  04/03/2019 04/02/2019 08/20/2018 11/24/2017 11/15/2017  Falls in the past year? 0 0 0 No No  Risk for fall due to : - - - - -  Follow up Falls evaluation completed;Falls prevention discussed - - - -     Objective:  Rebecca Wolfe seemed alert and oriented and she participated appropriately during our telephone visit.  Blood Pressure Weight BMI  BP Readings from Last 3 Encounters:  04/02/19 131/61  08/20/18 (!) 144/75  11/24/17 (!) 151/74   Wt Readings from Last 3 Encounters:  04/02/19 134 lb (60.8 kg)  08/20/18 135 lb (61.2 kg)  11/24/17 141 lb (64 kg)   BMI Readings from Last 1 Encounters:  04/02/19 23.00 kg/m    *Unable to  obtain current vital signs, weight, and BMI due to telephone visit type  Hearing/Vision  . Rebecca Wolfe did not seem to have difficulty with hearing/understanding during the telephone conversation . Reports that she has not had a formal eye exam by an eye care professional within the past year . Reports that she has not had a formal hearing evaluation within the past year *Unable to fully assess hearing and vision during telephone visit type  Cognitive Function: 6CIT Screen 04/03/2019  What Year? 0 points  What month? 0 points  What time? 0 points  Count back from 20 4 points  Months in reverse 4 points  Repeat phrase 2 points  Total Score 10   (  Normal:0-7, Significant for Dysfunction: >8)  Normal Cognitive Function Screening: No: Patient states at times she has a hard time remembering things.    Immunization & Health Maintenance Record Immunization History  Administered Date(s) Administered  . Fluad Quad(high Dose 65+) 04/02/2019  . Influenza, High Dose Seasonal PF 04/18/2017  . Influenza,inj,Quad PF,6+ Mos 04/24/2013, 04/25/2013, 06/30/2014, 03/31/2015  . Pneumococcal Polysaccharide-23 04/21/2009  . Td 04/21/2009    Health Maintenance  Topic Date Due  . DEXA SCAN  04/01/2020 (Originally 10/27/2008)  . COLONOSCOPY  04/01/2020 (Originally 10/27/1993)  . PNA vac Low Risk Adult (2 of 2 - PCV13) 04/01/2020 (Originally 04/21/2010)  . TETANUS/TDAP  04/22/2019  . INFLUENZA VACCINE  Completed  . Hepatitis C Screening  Completed       Assessment  This is a routine wellness examination for Rebecca Wolfe.  Health Maintenance: Due or Overdue There are no preventive care reminders to display for this patient.  Rebecca Wolfe does not need a referral for Community Assistance: Care Management:   no Social Work:    no Prescription Assistance:  no Nutrition/Diabetes Education:  no   Plan:  Personalized Goals Goals Addressed            This Visit's Progress   . AWV Goals        04/03/2019 AWV Goal: Fall Prevention  . Over the next year, patient will decrease their risk for falls by: o Using assistive devices, such as a cane or walker, as needed o Identifying fall risks within their home and correcting them by: - Removing throw rugs - Adding handrails to stairs or ramps - Removing clutter and keeping a clear pathway throughout the home - Increasing light, especially at night - Adding shower handles/bars - Raising toilet seat o Identifying potential personal risk factors for falls: - Medication side effects - Incontinence/urgency - Vestibular dysfunction - Hearing loss - Musculoskeletal disorders - Neurological disorders - Orthostatic hypotension        Personalized Health Maintenance & Screening Recommendations  Pneumococcal vaccine   Lung Cancer Screening Recommended: no (Low Dose CT Chest recommended if Age 92-80 years, 30 pack-year currently smoking OR have quit w/in past 15 years) Hepatitis C Screening recommended: no HIV Screening recommended: no  Advanced Directives: Written information was not prepared per patient's request.  Referrals & Orders No orders of the defined types were placed in this encounter.   Follow-up Plan . Follow-up with Bennie Pierini, FNP as planned . Schedule to have your second pneumonia vaccine.    I have personally reviewed and noted the following in the patient's chart:   . Medical and social history . Use of alcohol, tobacco or illicit drugs  . Current medications and supplements . Functional ability and status . Nutritional status . Physical activity . Advanced directives . List of other physicians . Hospitalizations, surgeries, and ER visits in previous 12 months . Vitals . Screenings to include cognitive, depression, and falls . Referrals and appointments  In addition, I have reviewed and discussed with Rebecca Wolfe certain preventive protocols, quality metrics, and best practice  recommendations. A written personalized care plan for preventive services as well as general preventive health recommendations is available and can be mailed to the patient at her request.      Rebecca Monday LPN  13/24/4010

## 2019-07-02 DIAGNOSIS — Z23 Encounter for immunization: Secondary | ICD-10-CM | POA: Diagnosis not present

## 2019-07-30 DIAGNOSIS — Z23 Encounter for immunization: Secondary | ICD-10-CM | POA: Diagnosis not present

## 2019-08-27 ENCOUNTER — Telehealth: Payer: Self-pay | Admitting: Nurse Practitioner

## 2019-08-27 NOTE — Chronic Care Management (AMB) (Deleted)
  Chronic Care Management   Note  08/27/2019 Name: Rebecca Wolfe MRN: [id number] DOB: August 15, 1945  Rebecca Wolfe is a 74 y.o. year old female who is a primary care patient of Chevis Pretty, [place]. I reached out to Rebecca Wolfe by phone today in response to a referral sent by Rebecca Wolfe health plan.     Rebecca Wolfe was given information about Chronic Care Management services today including:  1. CCM service includes personalized support from designated clinical staff supervised by her physician, including individualized plan of care and coordination with other care providers 2. 24/7 contact phone numbers for assistance for urgent and routine care needs. 3. Service will only be billed when office clinical staff spend 20 minutes or more in a month to coordinate care. 4. Only one practitioner may furnish and bill the service in a calendar month. 5. The patient may stop CCM services at any time (effective at the end of the month) by phone call to the office staff. 6. The patient will be responsible for cost sharing (co-pay) of up to 20% of the service fee (after annual deductible is met).  Patient did not agree to enrollment in care management services and does not wish to consider at this time.  Follow up plan: Telephone appointment with care management team member scheduled for:02/17/2020.  [place], [place] [address] Direct Dial: [phone] [email or web address] Website: [email or web address]

## 2019-08-27 NOTE — Chronic Care Management (AMB) (Signed)
  Chronic Care Management   Note  08/27/2019 Name: Rebecca Wolfe MRN: [id number] DOB: May 18, 1946  Rebecca Wolfe is a 74 y.o. year old female who is a primary care patient of Chevis Pretty, [place]. I reached out to Rebecca Wolfe by phone today in response to a referral sent by Ms. Rebecca Wolfe Rebecca Wolfe's health plan.     Rebecca Wolfe was given information about Chronic Care Management services today including:  1. CCM service includes personalized support from designated clinical staff supervised by her physician, including individualized plan of care and coordination with other care providers 2. 24/7 contact phone numbers for assistance for urgent and routine care needs. 3. Service will only be billed when office clinical staff spend 20 minutes or more in a month to coordinate care. 4. Only one practitioner may furnish and bill the service in a calendar month. 5. The patient may stop CCM services at any time (effective at the end of the month) by phone call to the office staff. 6. The patient will be responsible for cost sharing (co-pay) of up to 20% of the service fee (after annual deductible is met).  Patient agreed to services and verbal consent obtained.   Follow up plan: Telephone appointment with care management team member scheduled for:02/17/2020.  [place], [place] [address] Direct Dial: [phone] [email or web address] Website: [email or web address]

## 2019-09-26 ENCOUNTER — Other Ambulatory Visit: Payer: Self-pay | Admitting: Nurse Practitioner

## 2019-09-26 DIAGNOSIS — E782 Mixed hyperlipidemia: Secondary | ICD-10-CM

## 2019-09-28 ENCOUNTER — Other Ambulatory Visit: Payer: Self-pay | Admitting: Nurse Practitioner

## 2019-09-28 DIAGNOSIS — I10 Essential (primary) hypertension: Secondary | ICD-10-CM

## 2019-10-01 ENCOUNTER — Ambulatory Visit (INDEPENDENT_AMBULATORY_CARE_PROVIDER_SITE_OTHER): Payer: Medicare Other | Admitting: Nurse Practitioner

## 2019-10-01 ENCOUNTER — Encounter: Payer: Self-pay | Admitting: Nurse Practitioner

## 2019-10-01 DIAGNOSIS — J301 Allergic rhinitis due to pollen: Secondary | ICD-10-CM | POA: Diagnosis not present

## 2019-10-01 DIAGNOSIS — E782 Mixed hyperlipidemia: Secondary | ICD-10-CM

## 2019-10-01 DIAGNOSIS — I10 Essential (primary) hypertension: Secondary | ICD-10-CM | POA: Diagnosis not present

## 2019-10-01 MED ORDER — FLUTICASONE PROPIONATE 50 MCG/ACT NA SUSP: 2.0000 | Freq: Every day | NASAL | 6 refills | Status: DC

## 2019-10-01 MED ORDER — LISINOPRIL-HYDROCHLOROTHIAZIDE 20-12.5 MG PO TABS: 2.0000 | ORAL_TABLET | Freq: Every day | ORAL | 1 refills | Status: DC

## 2019-10-01 MED ORDER — ATORVASTATIN CALCIUM 40 MG PO TABS: 40.0000 mg | ORAL_TABLET | Freq: Every day | ORAL | 1 refills | Status: DC

## 2019-10-01 NOTE — Addendum Note (Signed)
Addended by: Bennie Pierini on: 10/01/2019 11:23 AM   Modules accepted: Orders

## 2019-10-01 NOTE — Progress Notes (Signed)
Virtual Visit via telephone Note Due to COVID-19 pandemic this visit was conducted virtually. This visit type was conducted due to national recommendations for restrictions regarding the COVID-19 Pandemic (e.g. social distancing, sheltering in place) in an effort to limit this patient's exposure and mitigate transmission in our community. All issues noted in this document were discussed and addressed.  A physical exam was not performed with this format.  I connected with Rebecca Wolfe on 10/01/19 at 10:45 by telephone and verified that I am speaking with the correct person using two identifiers. Rebecca Wolfe is currently located at home and no one is currently with  her during visit. The provider, Mary-Margaret Hassell Done, FNP is located in their office at time of visit.  I discussed the limitations, risks, security and privacy concerns of performing an evaluation and management service by telephone and the availability of in person appointments. I also discussed with the patient that there may be a patient responsible charge related to this service. The patient expressed understanding and agreed to proceed.   History and Present Illness:   Chief Complaint: Medical Management of Chronic Issues    HPI:  1. Essential hypertension No c/o chest pain, sob or headache. Does not check blood pressure at home. BP Readings from Last 3 Encounters:  04/02/19 131/61  08/20/18 (!) 144/75  11/24/17 (!) 151/74     2. Mixed hyperlipidemia Does try to watch and is riding stationary bike or walking daily. Lab Results  Component Value Date   CHOL 146 04/02/2019   HDL 63 04/02/2019   LDLCALC 67 04/02/2019   TRIG 81 04/02/2019   CHOLHDL 2.3 04/02/2019       Outpatient Encounter Medications as of 10/01/2019  Medication Sig  . acetaminophen (TYLENOL) 500 MG tablet Take 500 mg by mouth every 6 (six) hours as needed.  Marland Kitchen atorvastatin (LIPITOR) 40 MG tablet Take 1 tablet (40 mg total) by mouth daily  at 6 PM. Appointment needed for future refills.  . fluticasone (FLONASE) 50 MCG/ACT nasal spray Place 2 sprays into both nostrils daily.  Marland Kitchen lisinopril-hydrochlorothiazide (ZESTORETIC) 20-12.5 MG tablet Take 2 tablets by mouth daily.    History reviewed. No pertinent surgical history.  Family History  Problem Relation Age of Onset  . Stroke Father   . Hypertension Father   . Hyperlipidemia Sister   . Hypertension Sister   . Cancer Sister 58       ovarian  . Cancer Sister        Breast    New complaints: None today  Social history: Lives by herself and marcie and nicki check on her daily  Controlled substance contract: n/a    Review of Systems  Constitutional: Negative for diaphoresis and weight loss.  Eyes: Negative for blurred vision, double vision and pain.  Respiratory: Negative for shortness of breath.   Cardiovascular: Negative for chest pain, palpitations, orthopnea and leg swelling.  Gastrointestinal: Negative for abdominal pain.  Skin: Negative for rash.  Neurological: Negative for dizziness, sensory change, loss of consciousness, weakness and headaches.  Endo/Heme/Allergies: Negative for polydipsia. Does not bruise/bleed easily.  Psychiatric/Behavioral: Negative for memory loss. The patient does not have insomnia.   All other systems reviewed and are negative.    Observations/Objective: Alert and oriented- answers all questions appropriately No distress    Assessment and Plan: Rebecca Wolfe comes in today with chief complaint of Medical Management of Chronic Issues   Diagnosis and orders addressed:  1. Essential hypertension Low  sodium diet - lisinopril-hydrochlorothiazide (ZESTORETIC) 20-12.5 MG tablet; Take 2 tablets by mouth daily.  Dispense: 180 tablet; Refill: 1 - CBC with Differential/Platelet; Future - CMP14+EGFR; Future  2. Mixed hyperlipidemia Low fat diet - atorvastatin (LIPITOR) 40 MG tablet; Take 1 tablet (40 mg total) by mouth daily  at 6 PM. Appointment needed for future refills.  Dispense: 90 tablet; Refill: 1 - Lipid panel; Future   Labs pending Health Maintenance reviewed Diet and exercise encouraged  Follow up plan: 6 months     I discussed the assessment and treatment plan with the patient. The patient was provided an opportunity to ask questions and all were answered. The patient agreed with the plan and demonstrated an understanding of the instructions.   The patient was advised to call back or seek an in-person evaluation if the symptoms worsen or if the condition fails to improve as anticipated.  The above assessment and management plan was discussed with the patient. The patient verbalized understanding of and has agreed to the management plan. Patient is aware to call the clinic if symptoms persist or worsen. Patient is aware when to return to the clinic for a follow-up visit. Patient educated on when it is appropriate to go to the emergency department.   Time call ended:  10:58 I provided 13 minutes of non-face-to-face time during this encounter.    Mary-Margaret Hassell Done, FNP

## 2019-10-04 ENCOUNTER — Other Ambulatory Visit: Payer: Medicare Other

## 2019-10-04 DIAGNOSIS — E782 Mixed hyperlipidemia: Secondary | ICD-10-CM | POA: Diagnosis not present

## 2019-10-04 DIAGNOSIS — I10 Essential (primary) hypertension: Secondary | ICD-10-CM | POA: Diagnosis not present

## 2019-10-05 LAB — CMP14+EGFR
ALT: 18 IU/L (ref 0–32)
AST: 21 IU/L (ref 0–40)
Albumin/Globulin Ratio: 2.4 — ABNORMAL HIGH (ref 1.2–2.2)
Albumin: 4.8 g/dL — ABNORMAL HIGH (ref 3.7–4.7)
Alkaline Phosphatase: 50 IU/L (ref 39–117)
BUN/Creatinine Ratio: 18 (ref 12–28)
BUN: 19 mg/dL (ref 8–27)
Bilirubin Total: 0.4 mg/dL (ref 0.0–1.2)
CO2: 22 mmol/L (ref 20–29)
Calcium: 9.8 mg/dL (ref 8.7–10.3)
Chloride: 102 mmol/L (ref 96–106)
Creatinine, Ser: 1.03 mg/dL — ABNORMAL HIGH (ref 0.57–1.00)
GFR calc Af Amer: 61 mL/min/{1.73_m2} (ref >59)
GFR calc non Af Amer: 53 mL/min/{1.73_m2} — ABNORMAL LOW (ref >59)
Globulin, Total: 2 g/dL (ref 1.5–4.5)
Glucose: 89 mg/dL (ref 65–99)
Potassium: 4.8 mmol/L (ref 3.5–5.2)
Sodium: 137 mmol/L (ref 134–144)
Total Protein: 6.8 g/dL (ref 6.0–8.5)

## 2019-10-05 LAB — CBC WITH DIFFERENTIAL/PLATELET
Basophils Absolute: 0 10*3/uL (ref 0.0–0.2)
Basos: 1 %
EOS (ABSOLUTE): 0 10*3/uL (ref 0.0–0.4)
Eos: 1 %
Hematocrit: 36.9 % (ref 34.0–46.6)
Hemoglobin: 12 g/dL (ref 11.1–15.9)
Immature Grans (Abs): 0 10*3/uL (ref 0.0–0.1)
Immature Granulocytes: 0 %
Lymphocytes Absolute: 2.5 10*3/uL (ref 0.7–3.1)
Lymphs: 42 %
MCH: 31.6 pg (ref 26.6–33.0)
MCHC: 32.5 g/dL (ref 31.5–35.7)
MCV: 97 fL (ref 79–97)
Monocytes Absolute: 0.3 10*3/uL (ref 0.1–0.9)
Monocytes: 6 %
Neutrophils Absolute: 3 10*3/uL (ref 1.4–7.0)
Neutrophils: 50 %
Platelets: 249 10*3/uL (ref 150–450)
RBC: 3.8 x10E6/uL (ref 3.77–5.28)
RDW: 12 % (ref 11.7–15.4)
WBC: 5.9 10*3/uL (ref 3.4–10.8)

## 2019-10-05 LAB — LIPID PANEL
Chol/HDL Ratio: 2.3 ratio (ref 0.0–4.4)
Cholesterol, Total: 135 mg/dL (ref 100–199)
HDL: 60 mg/dL (ref >39)
LDL Chol Calc (NIH): 59 mg/dL (ref 0–99)
Triglycerides: 85 mg/dL (ref 0–149)
VLDL Cholesterol Cal: 16 mg/dL (ref 5–40)

## 2020-01-06 ENCOUNTER — Ambulatory Visit (INDEPENDENT_AMBULATORY_CARE_PROVIDER_SITE_OTHER): Payer: Medicare Other | Admitting: Nurse Practitioner

## 2020-01-06 ENCOUNTER — Other Ambulatory Visit: Payer: Self-pay

## 2020-01-06 ENCOUNTER — Encounter: Payer: Self-pay | Admitting: Nurse Practitioner

## 2020-01-06 VITALS — BP 121/60 | HR 73 | Temp 97.7°F | Resp 20 | Ht 64.0 in | Wt 130.0 lb

## 2020-01-06 DIAGNOSIS — M19049 Primary osteoarthritis, unspecified hand: Secondary | ICD-10-CM

## 2020-01-06 DIAGNOSIS — J301 Allergic rhinitis due to pollen: Secondary | ICD-10-CM | POA: Diagnosis not present

## 2020-01-06 MED ORDER — FLUTICASONE PROPIONATE 50 MCG/ACT NA SUSP: 2.0000 | Freq: Every day | NASAL | 6 refills | Status: AC

## 2020-01-06 MED ORDER — CELECOXIB 200 MG PO CAPS: 200.0000 mg | ORAL_CAPSULE | Freq: Two times a day (BID) | ORAL | 3 refills | Status: DC

## 2020-01-06 NOTE — Progress Notes (Signed)
   Subjective:    Patient ID: Rebecca Wolfe, female    DOB: 12/11/1945, 74 y.o.   MRN: [id number]   Chief Complaint: Arthritis in hands and Allergies   HPI Patient comes in  Today c/o: -  of arthritis both hands. Have been hurting her for a good while. She has been taking tylenol arthritis and it is helping but not as good as it was. - congestion with frequent drainage. She  has been on flonase in the past which has helped.      Review of Systems  Constitutional: Negative for diaphoresis.  HENT: Positive for postnasal drip, rhinorrhea and sneezing. Negative for ear pain, sinus pressure, sinus pain, sore throat, trouble swallowing and voice change.   Eyes: Negative for pain.  Respiratory: Negative for shortness of breath.   Cardiovascular: Negative for chest pain, palpitations and leg swelling.  Gastrointestinal: Negative for abdominal pain.  Endocrine: Negative for polydipsia.  Musculoskeletal: Positive for arthralgias (b;l hands).  Skin: Negative for rash.  Neurological: Negative for dizziness, weakness and headaches.  Hematological: Does not bruise/bleed easily.  All other systems reviewed and are negative.      Objective:   Physical Exam Vitals and nursing note reviewed.  Constitutional:      Appearance: Normal appearance.  HENT:     Head: Normocephalic.     Right Ear: Tympanic membrane normal.     Left Ear: Tympanic membrane normal.     Nose: Nose normal.     Mouth/Throat:     Mouth: Mucous membranes are moist.  Eyes:     Pupils: Pupils are equal, round, and reactive to light.  Cardiovascular:     Rate and Rhythm: Normal rate and regular rhythm.     Heart sounds: Normal heart sounds.  Pulmonary:     Breath sounds: Normal breath sounds.  Musculoskeletal:     Comments: Nodularity of PIP joint left thumb  Skin:    General: Skin is warm.  Neurological:     General: No focal deficit present.     Mental Status: She is alert and oriented to person, place, and  time.  Psychiatric:        Behavior: Behavior normal.    BP (!) 121/60   Pulse 73   Temp 97.7 F (36.5 C) (Temporal)   Resp 20   Ht 5\' 4"  (1.626 m)   Wt 130 lb (59 kg)   BMI 22.31 kg/m         Assessment & Plan:  Rebecca Wolfe in today with chief complaint of Arthritis in hands and Allergies   1. Seasonal allergic rhinitis due to pollen Avoid alergens - fluticasone (FLONASE) 50 MCG/ACT nasal spray; Place 2 sprays into both nostrils daily.  Dispense: 16 g; Refill: 6  2. Arthritis pain of hand Moist heat - celecoxib (CELEBREX) 200 MG capsule; Take 1 capsule (200 mg total) by mouth 2 (two) times daily.  Dispense: 60 capsule; Refill: 3    The above assessment and management plan was discussed with the patient. The patient verbalized understanding of and has agreed to the management plan. Patient is aware to call the clinic if symptoms persist or worsen. Patient is aware when to return to the clinic for a follow-up visit. Patient educated on when it is appropriate to go to the emergency department.   Mary-Margaret Tonna Corner, FNP

## 2020-01-06 NOTE — Patient Instructions (Signed)
Allergic Rhinitis, Adult Allergic rhinitis is a reaction to allergens in the air. Allergens are tiny specks (particles) in the air that cause your body to have an allergic reaction. This condition cannot be passed from person to person (is not contagious). Allergic rhinitis cannot be cured, but it can be controlled. There are two types of allergic rhinitis:  Seasonal. This type is also called hay fever. It happens only during certain times of the year.  Perennial. This type can happen at any time of the year. What are the causes? This condition may be caused by:  Pollen from grasses, trees, and weeds.  House dust mites.  Pet dander.  Mold. What are the signs or symptoms? Symptoms of this condition include:  Sneezing.  Runny or stuffy nose (nasal congestion).  A lot of mucus in the back of the throat (postnasal drip).  Itchy nose.  Tearing of the eyes.  Trouble sleeping.  Being sleepy during day. How is this treated? There is no cure for this condition. You should avoid things that trigger your symptoms (allergens). Treatment can help to relieve symptoms. This may include:  Medicines that block allergy symptoms, such as antihistamines. These may be given as a shot, nasal spray, or pill.  Shots that are given until your body becomes less sensitive to the allergen (desensitization).  Stronger medicines, if all other treatments have not worked. Follow these instructions at home: Avoiding allergens   Find out what you are allergic to. Common allergens include smoke, dust, and pollen.  Avoid them if you can. These are some of the things that you can do to avoid allergens: ? Replace carpet with wood, tile, or vinyl flooring. Carpet can trap dander and dust. ? Clean any mold found in the home. ? Do not smoke. Do not allow smoking in your home. ? Change your heating and air conditioning filter at least once a month. ? During allergy season:  Keep windows closed as much as  you can. If possible, use air conditioning when there is a lot of pollen in the air.  Use a special filter for allergies with your furnace and air conditioner.  Plan outdoor activities when pollen counts are lowest. This is usually during the early morning or evening hours.  If you do go outdoors when pollen count is high, wear a special mask for people with allergies.  When you come indoors, take a shower and change your clothes before sitting on furniture or bedding. General instructions  Do not use fans in your home.  Do not hang clothes outside to dry.  Wear sunglasses to keep pollen out of your eyes.  Wash your hands right away after you touch household pets.  Take over-the-counter and prescription medicines only as told by your doctor.  Keep all follow-up visits as told by your doctor. This is important. Contact a doctor if:  You have a fever.  You have a cough that does not go away (is persistent).  You start to make whistling sounds when you breathe (wheeze).  Your symptoms do not get better with treatment.  You have thick fluid coming from your nose.  You start to have nosebleeds. Get help right away if:  Your tongue or your lips are swollen.  You have trouble breathing.  You feel dizzy or you feel like you are going to pass out (faint).  You have cold sweats. Summary  Allergic rhinitis is a reaction to allergens in the air.  This condition may be   caused by allergens. These include pollen, dust mites, pet dander, and mold.  Symptoms include a runny, itchy nose, sneezing, or tearing eyes. You may also have trouble sleeping or feel sleepy during the day.  Treatment includes taking medicines and avoiding allergens. You may also get shots or take stronger medicines.  Get help if you have a fever or a cough that does not stop. Get help right away if you are short of breath. This information is not intended to replace advice given to you by your health care  provider. Make sure you discuss any questions you have with your health care provider. Document Revised: 09/11/2018 Document Reviewed: 12/12/2017 Elsevier Patient Education  2020 [employer]vier Inc.  

## 2020-02-17 ENCOUNTER — Ambulatory Visit: Payer: Medicare Other | Admitting: *Deleted

## 2020-02-17 DIAGNOSIS — I10 Essential (primary) hypertension: Secondary | ICD-10-CM

## 2020-02-17 DIAGNOSIS — E782 Mixed hyperlipidemia: Secondary | ICD-10-CM

## 2020-02-17 NOTE — Patient Instructions (Signed)
Reach out to [facility] team as needed.  Demetrios Loll, BSN, RN-BC Embedded Chronic Care Manager [facility] / [facility] Care Management Direct Dial: [phone]

## 2020-02-17 NOTE — Chronic Care Management (AMB) (Signed)
  Chronic Care Management   Initial Visit Note  02/17/2020 Name: Rebecca Wolfe MRN: [id number] DOB: [birth year]  Referred by: Rebecca Pierini, FNP Reason for referral : Chronic Care Management (Initial Visit)   Rebecca Wolfe is a 74 y.o. year old female who is a primary care patient of Rebecca Pierini, FNP. The CCM team was consulted for assistance with chronic disease management and care coordination needs related to HTN and HLD  Review of patient status, including review of consultants reports, relevant laboratory and other test results, and collaboration with appropriate care team members and the patient's provider was performed as part of comprehensive patient evaluation and provision of chronic care management services.    Subjective: I spoke with Rebecca Wolfe by telephone today regarding management of her chronic medical conditions. She lives alone but has help from her daughters when necessary. She is very physically active and walks daily. She does not have any CCM or resource needs at this time and feels that her medical conditions are well managed. She appreciated the outreach but does not feel that CCM enrollment is needed at this time but will reach out in the future if that changes.   SDOH (Social Determinants of Health) assessments performed: Yes See Care Plan activities for detailed interventions related to SDOH     Objective: Outpatient Encounter Medications as of 02/17/2020  Medication Sig  . acetaminophen (TYLENOL) 500 MG tablet Take 500 mg by mouth every 6 (six) hours as needed.  Marland Kitchen atorvastatin (LIPITOR) 40 MG tablet Take 1 tablet (40 mg total) by mouth daily at 6 PM. Appointment needed for future refills.  . celecoxib (CELEBREX) 200 MG capsule Take 1 capsule (200 mg total) by mouth 2 (two) times daily.  . fluticasone (FLONASE) 50 MCG/ACT nasal spray Place 2 sprays into both nostrils daily.  Marland Kitchen lisinopril-hydrochlorothiazide (ZESTORETIC) 20-12.5 MG tablet Take 2  tablets by mouth daily.   No facility-administered encounter medications on file as of 02/17/2020.     Lab Results  Component Value Date   CHOL 135 10/04/2019   HDL 60 10/04/2019   LDLCALC 59 10/04/2019   TRIG 85 10/04/2019   CHOLHDL 2.3 10/04/2019   BP Readings from Last 3 Encounters:  01/06/20 (!) 121/60  04/02/19 131/61  08/20/18 (!) 144/75     Plan:   CCM enrollment status changed to "previously enrolled" as per patient request on 02/17/20 to discontinue enrollment. Case closed to case management services in primary care home.   Rebecca Wolfe, BSN, RN-BC Embedded Chronic Care Manager [facility] / [facility] Care Management Direct Dial: [phone]

## 2020-04-02 ENCOUNTER — Ambulatory Visit (INDEPENDENT_AMBULATORY_CARE_PROVIDER_SITE_OTHER): Payer: Medicare Other | Admitting: Nurse Practitioner

## 2020-04-02 ENCOUNTER — Encounter: Payer: Self-pay | Admitting: Nurse Practitioner

## 2020-04-02 DIAGNOSIS — E782 Mixed hyperlipidemia: Secondary | ICD-10-CM | POA: Diagnosis not present

## 2020-04-02 DIAGNOSIS — I10 Essential (primary) hypertension: Secondary | ICD-10-CM

## 2020-04-02 MED ORDER — ATORVASTATIN CALCIUM 40 MG PO TABS: 40.0000 mg | ORAL_TABLET | Freq: Every day | ORAL | 1 refills | Status: DC

## 2020-04-02 MED ORDER — LISINOPRIL-HYDROCHLOROTHIAZIDE 20-12.5 MG PO TABS: 2.0000 | ORAL_TABLET | Freq: Every day | ORAL | 1 refills | Status: DC

## 2020-04-02 NOTE — Progress Notes (Signed)
Virtual Visit via telephone Note Due to COVID-19 pandemic this visit was conducted virtually. This visit type was conducted due to national recommendations for restrictions regarding the COVID-19 Pandemic (e.g. social distancing, sheltering in place) in an effort to limit this patient's exposure and mitigate transmission in our community. All issues noted in this document were discussed and addressed.  A physical exam was not performed with this format.  I connected with Rebecca Wolfe on 04/02/20 at 10:20 by telephone and verified that I am speaking with the correct person using two identifiers. Rebecca Wolfe is currently located at home and non oe is currently with her during visit. The provider, Mary-Margaret Daphine Deutscher, FNP is located in their office at time of visit.  I discussed the limitations, risks, security and privacy concerns of performing an evaluation and management service by telephone and the availability of in person appointments. I also discussed with the patient that there may be a patient responsible charge related to this service. The patient expressed understanding and agreed to proceed.   History and Present Illness:   Chief Complaint: Medical Management of Chronic Issues    HPI:  1. Primary hypertension No c/o chest pain, sob or headache. Does check blood pressure at home. BP Readings from Last 3 Encounters:  01/06/20 (!) 121/60  04/02/19 131/61  08/20/18 (!) 144/75     2. Mixed hyperlipidemia She watches diet and walks every day. She also has a stationary bike in her room. Lab Results  Component Value Date   CHOL 135 10/04/2019   HDL 60 10/04/2019   LDLCALC 59 10/04/2019   TRIG 85 10/04/2019   CHOLHDL 2.3 10/04/2019       Outpatient Encounter Medications as of 04/02/2020  Medication Sig   acetaminophen (TYLENOL) 500 MG tablet Take 500 mg by mouth every 6 (six) hours as needed.   atorvastatin (LIPITOR) 40 MG tablet Take 1 tablet (40 mg total) by  mouth daily at 6 PM. Appointment needed for future refills.   celecoxib (CELEBREX) 200 MG capsule Take 1 capsule (200 mg total) by mouth 2 (two) times daily.   fluticasone (FLONASE) 50 MCG/ACT nasal spray Place 2 sprays into both nostrils daily.   lisinopril-hydrochlorothiazide (ZESTORETIC) 20-12.5 MG tablet Take 2 tablets by mouth daily.   No facility-administered encounter medications on file as of 04/02/2020.    History reviewed. No pertinent surgical history.  Family History  Problem Relation Age of Onset   Stroke Father    Hypertension Father    Hyperlipidemia Sister    Hypertension Sister    Cancer Sister 66       ovarian   Cancer Sister        Breast    New complaints: None today  Social history: Lives by herself. Her daughters check on her daily  Controlled substance contract: n/a    Review of Systems  Constitutional: Negative for diaphoresis and weight loss.  Eyes: Negative for blurred vision, double vision and pain.  Respiratory: Negative for shortness of breath.   Cardiovascular: Negative for chest pain, palpitations, orthopnea and leg swelling.  Gastrointestinal: Negative for abdominal pain.  Skin: Negative for rash.  Neurological: Negative for dizziness, sensory change, loss of consciousness, weakness and headaches.  Endo/Heme/Allergies: Negative for polydipsia. Does not bruise/bleed easily.  Psychiatric/Behavioral: Negative for memory loss. The patient does not have insomnia.   All other systems reviewed and are negative.    Observations/Objective: Alert and oriented- answers all questions appropriately No distress  Assessment and Plan: Rebecca Wolfe comes in today with chief complaint of Medical Management of Chronic Issues   Diagnosis and orders addressed:  1. Primary hypertension Low sodium diet  lisinopril-hydrochlorothiazide (ZESTORETIC) 20-12.5 MG tablet; Take 2 tablets by mouth daily.  Dispense: 180 tablet; Refill: 1  2.  Mixed hyperlipidemia Low fat diet - atorvastatin (LIPITOR) 40 MG tablet; Take 1 tablet (40 mg total) by mouth daily at 6 PM. Appointment needed for future refills.  Dispense: 90 tablet; Refill: 1   Labs pending Health Maintenance reviewed Diet and exercise encouraged  Follow up plan: 6 months      I discussed the assessment and treatment plan with the patient. The patient was provided an opportunity to ask questions and all were answered. The patient agreed with the plan and demonstrated an understanding of the instructions.   The patient was advised to call back or seek an in-person evaluation if the symptoms worsen or if the condition fails to improve as anticipated.  The above assessment and management plan was discussed with the patient. The patient verbalized understanding of and has agreed to the management plan. Patient is aware to call the clinic if symptoms persist or worsen. Patient is aware when to return to the clinic for a follow-up visit. Patient educated on when it is appropriate to go to the emergency department.   Time call ended:  10:38  I provided 18 minutes of non-face-to-face time during this encounter.    Mary-Margaret Daphine Deutscher, FNP

## 2020-04-07 IMAGING — DX DG NECK SOFT TISSUE
2 series · 2 of 2 positions shown · non-contrast
Comparison: None.

CLINICAL DATA: Swallowed apple last night. Feels like it is stuck
in the throat.

EXAM:
NECK SOFT TISSUES - 1+ VIEW

[neck lat]
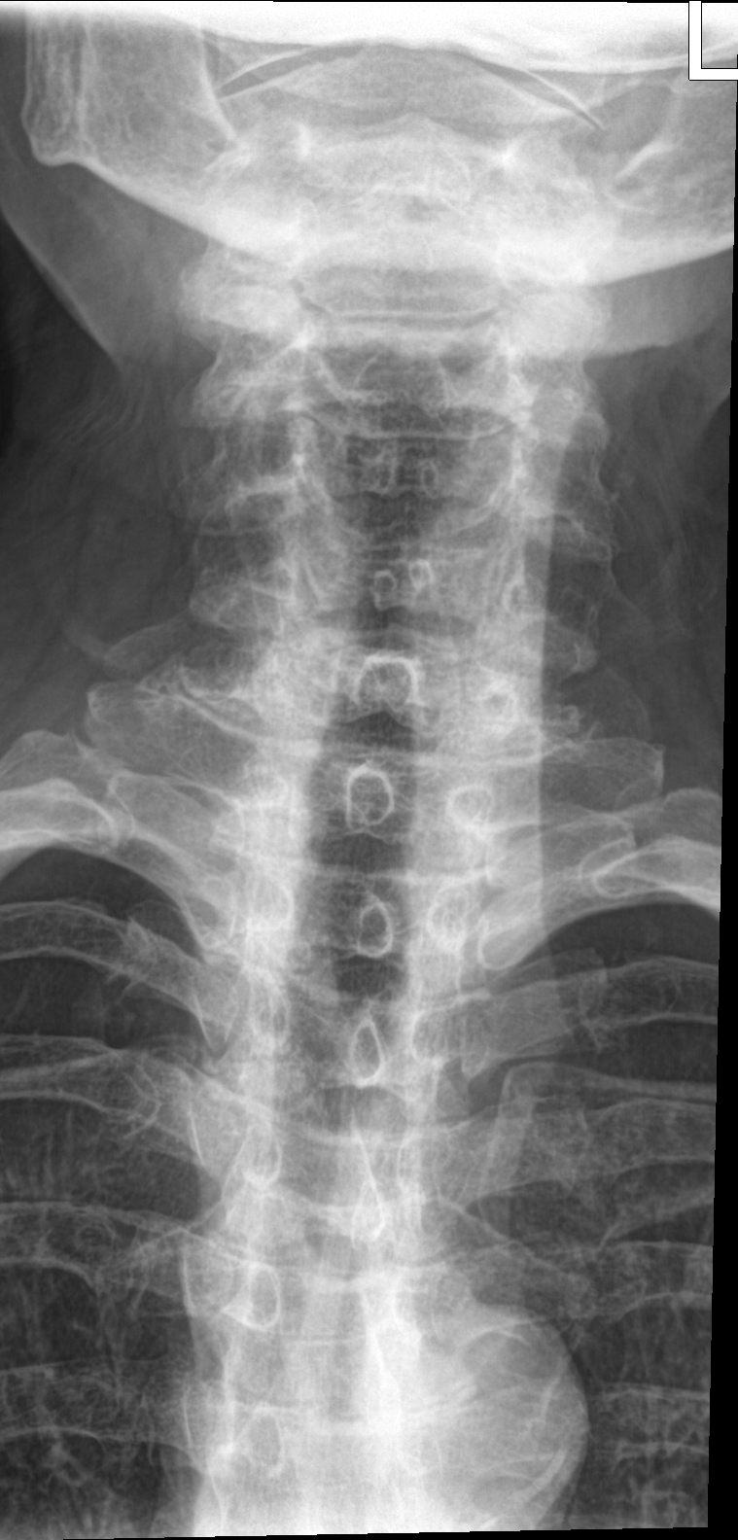

[neck ap]
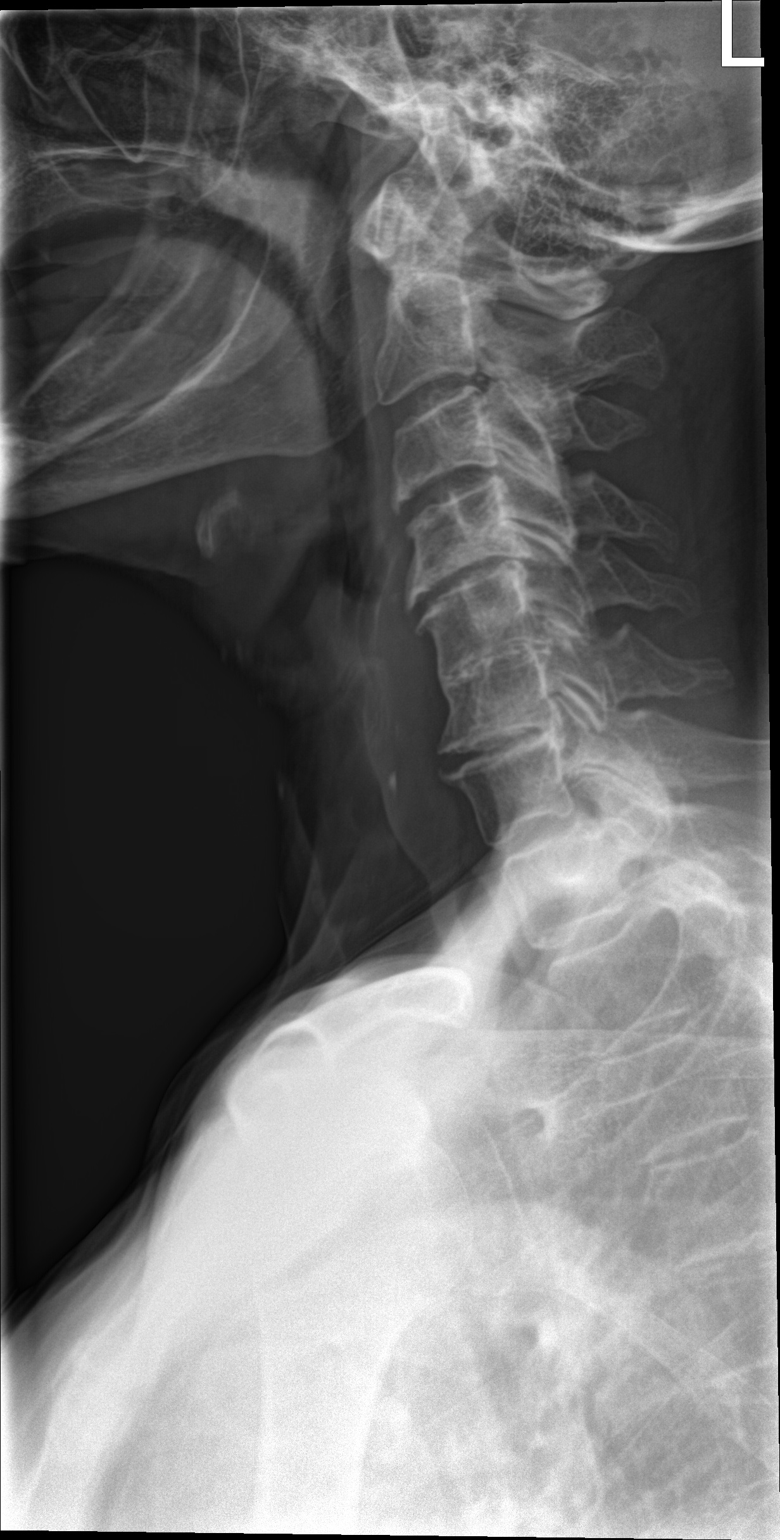

[2 of 2 positions shown; findings below may reference images not displayed]

FINDINGS: There is no evidence of retropharyngeal soft tissue swelling or
epiglottic enlargement. The cervical airway is unremarkable and no
radio-opaque foreign body identified.
IMPRESSION: Negative.

## 2020-07-05 ENCOUNTER — Other Ambulatory Visit: Payer: Self-pay | Admitting: Nurse Practitioner

## 2020-07-05 DIAGNOSIS — M19049 Primary osteoarthritis, unspecified hand: Secondary | ICD-10-CM

## 2020-10-01 ENCOUNTER — Encounter: Payer: Self-pay | Admitting: Nurse Practitioner

## 2020-10-01 ENCOUNTER — Ambulatory Visit (INDEPENDENT_AMBULATORY_CARE_PROVIDER_SITE_OTHER): Payer: Medicare Other | Admitting: Nurse Practitioner

## 2020-10-01 ENCOUNTER — Other Ambulatory Visit: Payer: Self-pay

## 2020-10-01 VITALS — BP 131/58 | HR 65 | Temp 97.3°F | Resp 20 | Ht 64.0 in | Wt 134.0 lb

## 2020-10-01 DIAGNOSIS — E782 Mixed hyperlipidemia: Secondary | ICD-10-CM | POA: Diagnosis not present

## 2020-10-01 DIAGNOSIS — I10 Essential (primary) hypertension: Secondary | ICD-10-CM

## 2020-10-01 DIAGNOSIS — Z23 Encounter for immunization: Secondary | ICD-10-CM | POA: Diagnosis not present

## 2020-10-01 MED ORDER — ATORVASTATIN CALCIUM 40 MG PO TABS: 40.0000 mg | ORAL_TABLET | Freq: Every day | ORAL | 1 refills | Status: DC

## 2020-10-01 MED ORDER — LISINOPRIL-HYDROCHLOROTHIAZIDE 20-12.5 MG PO TABS: 2.0000 | ORAL_TABLET | Freq: Every day | ORAL | 1 refills | Status: DC

## 2020-10-01 NOTE — Addendum Note (Signed)
Addended by: Cleda Daub on: 10/01/2020 12:44 PM   Modules accepted: Orders

## 2020-10-01 NOTE — Patient Instructions (Signed)

## 2020-10-01 NOTE — Progress Notes (Signed)
Subjective:    Patient ID: Rebecca Wolfe, female    DOB: 10-08-1945, 75 y.o.   MRN: [id number]   Chief Complaint: Medical Management of Chronic Issues    HPI:  1. Primary hypertension No c/o chest pain, ob or headache.doe snot check blood pressure at home. BP Readings from Last 3 Encounters:  10/01/20 (!) 131/58  01/06/20 (!) 121/60  04/02/19 131/61     2. Mixed hyperlipidemia Does watch diet and walks everyday for exercise Lab Results  Component Value Date   CHOL 135 10/04/2019   HDL 60 10/04/2019   LDLCALC 59 10/04/2019   TRIG 85 10/04/2019   CHOLHDL 2.3 10/04/2019       Outpatient Encounter Medications as of 10/01/2020  Medication Sig  . celecoxib (CELEBREX) 200 MG capsule TAKE 1 CAPSULE BY MOUTH TWICE A DAY (Patient not taking: Reported on 10/01/2020)  . lisinopril-hydrochlorothiazide (ZESTORETIC) 20-12.5 MG tablet Take 2 tablets by mouth daily.  Marland Kitchen acetaminophen (TYLENOL) 500 MG tablet Take 500 mg by mouth every 6 (six) hours as needed. (Patient not taking: Reported on 10/01/2020)  . atorvastatin (LIPITOR) 40 MG tablet Take 1 tablet (40 mg total) by mouth daily at 6 PM. Appointment needed for future refills. (Patient not taking: Reported on 10/01/2020)  . fluticasone (FLONASE) 50 MCG/ACT nasal spray Place 2 sprays into both nostrils daily. (Patient not taking: Reported on 10/01/2020)   No facility-administered encounter medications on file as of 10/01/2020.    History reviewed. No pertinent surgical history.  Family History  Problem Relation Age of Onset  . Stroke Father   . Hypertension Father   . Hyperlipidemia Sister   . Hypertension Sister   . Cancer Sister 85       ovarian  . Cancer Sister        Breast    New complaints: None today  Social history: Lives by herself. Her daughters check on her daily  Controlled substance contract: n/a    Review of Systems  Constitutional: Negative for diaphoresis.  Eyes: Negative for pain.  Respiratory:  Negative for shortness of breath.   Cardiovascular: Negative for chest pain, palpitations and leg swelling.  Gastrointestinal: Negative for abdominal pain.  Endocrine: Negative for polydipsia.  Skin: Negative for rash.  Neurological: Negative for dizziness, weakness and headaches.  Hematological: Does not bruise/bleed easily.  All other systems reviewed and are negative.      Objective:   Physical Exam Vitals and nursing note reviewed.  Constitutional:      General: She is not in acute distress.    Appearance: Normal appearance. She is well-developed.  HENT:     Head: Normocephalic.     Nose: Nose normal.  Eyes:     Pupils: Pupils are equal, round, and reactive to light.  Neck:     Vascular: No carotid bruit or JVD.  Cardiovascular:     Rate and Rhythm: Normal rate and regular rhythm.     Heart sounds: Normal heart sounds.  Pulmonary:     Effort: Pulmonary effort is normal. No respiratory distress.     Breath sounds: Normal breath sounds. No wheezing or rales.  Chest:     Chest wall: No tenderness.  Abdominal:     General: Bowel sounds are normal. There is no distension or abdominal bruit.     Palpations: Abdomen is soft. There is no hepatomegaly, splenomegaly, mass or pulsatile mass.     Tenderness: There is no abdominal tenderness.  Musculoskeletal:  General: Normal range of motion.     Cervical back: Normal range of motion and neck supple.  Lymphadenopathy:     Cervical: No cervical adenopathy.  Skin:    General: Skin is warm and dry.  Neurological:     Mental Status: She is alert and oriented to person, place, and time.     Deep Tendon Reflexes: Reflexes are normal and symmetric.  Psychiatric:        Behavior: Behavior normal.        Thought Content: Thought content normal.        Judgment: Judgment normal.     BP (!) 131/58   Pulse 65   Temp (!) 97.3 F (36.3 C) (Temporal)   Resp 20   Ht '5\' 4"'  (1.626 m)   Wt 134 lb (60.8 kg)   SpO2 97%   BMI  23.00 kg/m       Assessment & Plan:  Rebecca Wolfe comes in today with chief complaint of Medical Management of Chronic Issues   Diagnosis and orders addressed:  1. Primary hypertension Low sodium diet - lisinopril-hydrochlorothiazide (ZESTORETIC) 20-12.5 MG tablet; Take 2 tablets by mouth daily.  Dispense: 180 tablet; Refill: 1 - CBC with Differential/Platelet - CMP14+EGFR  2. Mixed hyperlipidemia Low fat diet - atorvastatin (LIPITOR) 40 MG tablet; Take 1 tablet (40 mg total) by mouth daily at 6 PM. Appointment needed for future refills.  Dispense: 90 tablet; Refill: 1 - Lipid panel   Labs pending Health Maintenance reviewed Diet and exercise encouraged  Follow up plan: 6 months   [place], FNP

## 2020-10-02 LAB — CBC WITH DIFFERENTIAL/PLATELET
Basophils Absolute: 0 10*3/uL (ref 0.0–0.2)
Basos: 1 %
EOS (ABSOLUTE): 0.1 10*3/uL (ref 0.0–0.4)
Eos: 1 %
Hematocrit: 35.5 % (ref 34.0–46.6)
Hemoglobin: 12 g/dL (ref 11.1–15.9)
Immature Grans (Abs): 0 10*3/uL (ref 0.0–0.1)
Immature Granulocytes: 0 %
Lymphocytes Absolute: 2.1 10*3/uL (ref 0.7–3.1)
Lymphs: 37 %
MCH: 30.1 pg (ref 26.6–33.0)
MCHC: 33.8 g/dL (ref 31.5–35.7)
MCV: 89 fL (ref 79–97)
Monocytes Absolute: 0.5 10*3/uL (ref 0.1–0.9)
Monocytes: 8 %
Neutrophils Absolute: 3.1 10*3/uL (ref 1.4–7.0)
Neutrophils: 53 %
Platelets: 258 10*3/uL (ref 150–450)
RBC: 3.99 x10E6/uL (ref 3.77–5.28)
RDW: 12.1 % (ref 11.7–15.4)
WBC: 5.8 10*3/uL (ref 3.4–10.8)

## 2020-10-02 LAB — CMP14+EGFR
ALT: 20 IU/L (ref 0–32)
AST: 22 IU/L (ref 0–40)
Albumin/Globulin Ratio: 2.1 (ref 1.2–2.2)
Albumin: 4.9 g/dL — ABNORMAL HIGH (ref 3.7–4.7)
Alkaline Phosphatase: 65 IU/L (ref 44–121)
BUN/Creatinine Ratio: 19 (ref 12–28)
BUN: 22 mg/dL (ref 8–27)
Bilirubin Total: 0.3 mg/dL (ref 0.0–1.2)
CO2: 22 mmol/L (ref 20–29)
Calcium: 10 mg/dL (ref 8.7–10.3)
Chloride: 100 mmol/L (ref 96–106)
Creatinine, Ser: 1.18 mg/dL — ABNORMAL HIGH (ref 0.57–1.00)
Globulin, Total: 2.3 g/dL (ref 1.5–4.5)
Glucose: 87 mg/dL (ref 65–99)
Potassium: 5.1 mmol/L (ref 3.5–5.2)
Sodium: 137 mmol/L (ref 134–144)
Total Protein: 7.2 g/dL (ref 6.0–8.5)
eGFR: 48 mL/min/{1.73_m2} — ABNORMAL LOW (ref >59)

## 2020-10-02 LAB — LIPID PANEL
Chol/HDL Ratio: 3.6 ratio (ref 0.0–4.4)
Cholesterol, Total: 225 mg/dL — ABNORMAL HIGH (ref 100–199)
HDL: 63 mg/dL (ref >39)
LDL Chol Calc (NIH): 143 mg/dL — ABNORMAL HIGH (ref 0–99)
Triglycerides: 109 mg/dL (ref 0–149)
VLDL Cholesterol Cal: 19 mg/dL (ref 5–40)

## 2021-03-05 ENCOUNTER — Ambulatory Visit (INDEPENDENT_AMBULATORY_CARE_PROVIDER_SITE_OTHER): Payer: Medicare Other

## 2021-03-05 VITALS — Ht 64.0 in | Wt 134.0 lb

## 2021-03-05 DIAGNOSIS — Z0000 Encounter for general adult medical examination without abnormal findings: Secondary | ICD-10-CM | POA: Diagnosis not present

## 2021-03-05 NOTE — Patient Instructions (Signed)
Rebecca Wolfe , Thank you for taking time to come for your Medicare Wellness Visit. I appreciate your ongoing commitment to your health goals. Please review the following plan we discussed and let me know if I can assist you in the future.   Screening recommendations/referrals: Colonoscopy: Declined Mammogram: Declined Bone Density: Declined Recommended yearly ophthalmology/optometry visit for glaucoma screening and checkup Recommended yearly dental visit for hygiene and checkup  Vaccinations: Influenza vaccine: Due every fall Pneumococcal vaccine: Done 04/21/2009 & 10/01/2020 Tdap vaccine: Done 04/21/2009 - Repeat in 10 years *due Shingles vaccine: Shingrix discussed. Please contact your pharmacy for coverage information.     Covid-19: Done 06/19/2020 - due for boosters  Advanced directives: Please bring a copy of your health care power of attorney and living will to the office to be added to your chart at your convenience.   Conditions/risks identified: Aim for 30 minutes of exercise or brisk walking each day, drink 6-8 glasses of water and eat lots of fruits and vegetables.   Next appointment: Follow up in one year for your annual wellness visit    Preventive Care 65 Years and Older, Female Preventive care refers to lifestyle choices and visits with your health care provider that can promote health and wellness. What does preventive care include? A yearly physical exam. This is also called an annual well check. Dental exams once or twice a year. Routine eye exams. Ask your health care provider how often you should have your eyes checked. Personal lifestyle choices, including: Daily care of your teeth and gums. Regular physical activity. Eating a healthy diet. Avoiding tobacco and drug use. Limiting alcohol use. Practicing safe sex. Taking low-dose aspirin every day. Taking vitamin and mineral supplements as recommended by your health care provider. What happens during an annual  well check? The services and screenings done by your health care provider during your annual well check will depend on your age, overall health, lifestyle risk factors, and family history of disease. Counseling  Your health care provider may ask you questions about your: Alcohol use. Tobacco use. Drug use. Emotional well-being. Home and relationship well-being. Sexual activity. Eating habits. History of falls. Memory and ability to understand (cognition). Work and work Astronomer. Reproductive health. Screening  You may have the following tests or measurements: Height, weight, and BMI. Blood pressure. Lipid and cholesterol levels. These may be checked every 5 years, or more frequently if you are over 90 years old. Skin check. Lung cancer screening. You may have this screening every year starting at age 53 if you have a 30-pack-year history of smoking and currently smoke or have quit within the past 15 years. Fecal occult blood test (FOBT) of the stool. You may have this test every year starting at age 6. Flexible sigmoidoscopy or colonoscopy. You may have a sigmoidoscopy every 5 years or a colonoscopy every 10 years starting at age 15. Hepatitis C blood test. Hepatitis B blood test. Sexually transmitted disease (STD) testing. Diabetes screening. This is done by checking your blood sugar (glucose) after you have not eaten for a while (fasting). You may have this done every 1-3 years. Bone density scan. This is done to screen for osteoporosis. You may have this done starting at age 40. Mammogram. This may be done every 1-2 years. Talk to your health care provider about how often you should have regular mammograms. Talk with your health care provider about your test results, treatment options, and if necessary, the need for more tests. Vaccines  Your  health care provider may recommend certain vaccines, such as: Influenza vaccine. This is recommended every year. Tetanus, diphtheria,  and acellular pertussis (Tdap, Td) vaccine. You may need a Td booster every 10 years. Zoster vaccine. You may need this after age 59. Pneumococcal 13-valent conjugate (PCV13) vaccine. One dose is recommended after age 49. Pneumococcal polysaccharide (PPSV23) vaccine. One dose is recommended after age 17. Talk to your health care provider about which screenings and vaccines you need and how often you need them. This information is not intended to replace advice given to you by your health care provider. Make sure you discuss any questions you have with your health care provider. Document Released: 06/19/2015 Document Revised: 02/10/2016 Document Reviewed: 03/24/2015 Elsevier Interactive Patient Education  2017 [employer] Prevention in the Home Falls can cause injuries. They can happen to people of all ages. There are many things you can do to make your home safe and to help prevent falls. What can I do on the outside of my home? Regularly fix the edges of walkways and driveways and fix any cracks. Remove anything that might make you trip as you walk through a door, such as a raised step or threshold. Trim any bushes or trees on the path to your home. Use bright outdoor lighting. Clear any walking paths of anything that might make someone trip, such as rocks or tools. Regularly check to see if handrails are loose or broken. Make sure that both sides of any steps have handrails. Any raised decks and porches should have guardrails on the edges. Have any leaves, snow, or ice cleared regularly. Use sand or salt on walking paths during winter. Clean up any spills in your garage right away. This includes oil or grease spills. What can I do in the bathroom? Use night lights. Install grab bars by the toilet and in the tub and shower. Do not use towel bars as grab bars. Use non-skid mats or decals in the tub or shower. If you need to sit down in the shower, use a plastic, non-slip  stool. Keep the floor dry. Clean up any water that spills on the floor as soon as it happens. Remove soap buildup in the tub or shower regularly. Attach bath mats securely with double-sided non-slip rug tape. Do not have throw rugs and other things on the floor that can make you trip. What can I do in the bedroom? Use night lights. Make sure that you have a light by your bed that is easy to reach. Do not use any sheets or blankets that are too big for your bed. They should not hang down onto the floor. Have a firm chair that has side arms. You can use this for support while you get dressed. Do not have throw rugs and other things on the floor that can make you trip. What can I do in the kitchen? Clean up any spills right away. Avoid walking on wet floors. Keep items that you use a lot in easy-to-reach places. If you need to reach something above you, use a strong step stool that has a grab bar. Keep electrical cords out of the way. Do not use floor polish or wax that makes floors slippery. If you must use wax, use non-skid floor wax. Do not have throw rugs and other things on the floor that can make you trip. What can I do with my stairs? Do not leave any items on the stairs. Make sure that there are handrails  on both sides of the stairs and use them. Fix handrails that are broken or loose. Make sure that handrails are as long as the stairways. Check any carpeting to make sure that it is firmly attached to the stairs. Fix any carpet that is loose or worn. Avoid having throw rugs at the top or bottom of the stairs. If you do have throw rugs, attach them to the floor with carpet tape. Make sure that you have a light switch at the top of the stairs and the bottom of the stairs. If you do not have them, ask someone to add them for you. What else can I do to help prevent falls? Wear shoes that: Do not have high heels. Have rubber bottoms. Are comfortable and fit you well. Are closed at the  toe. Do not wear sandals. If you use a stepladder: Make sure that it is fully opened. Do not climb a closed stepladder. Make sure that both sides of the stepladder are locked into place. Ask someone to hold it for you, if possible. Clearly mark and make sure that you can see: Any grab bars or handrails. First and last steps. Where the edge of each step is. Use tools that help you move around (mobility aids) if they are needed. These include: Canes. Walkers. Scooters. Crutches. Turn on the lights when you go into a dark area. Replace any light bulbs as soon as they burn out. Set up your furniture so you have a clear path. Avoid moving your furniture around. If any of your floors are uneven, fix them. If there are any pets around you, be aware of where they are. Review your medicines with your doctor. Some medicines can make you feel dizzy. This can increase your chance of falling. Ask your doctor what other things that you can do to help prevent falls. This information is not intended to replace advice given to you by your health care provider. Make sure you discuss any questions you have with your health care provider. Document Released: 03/19/2009 Document Revised: 10/29/2015 Document Reviewed: 06/27/2014 Elsevier Interactive Patient Education  2017 [employer].

## 2021-03-05 NOTE — Progress Notes (Signed)
Subjective:   Rebecca Wolfe is a 75 y.o. female who presents for Medicare Annual (Subsequent) preventive examination.  Virtual Visit via Telephone Note  I connected with  Rebecca Wolfe on 03/05/21 at  3:30 PM EDT by telephone and verified that I am speaking with the correct person using two identifiers.  Location: Patient: Home Provider: [facility] Persons participating in the virtual visit: patient/Nurse Health Advisor   I discussed the limitations, risks, security and privacy concerns of performing an evaluation and management service by telephone and the availability of in person appointments. The patient expressed understanding and agreed to proceed.  Interactive audio and video telecommunications were attempted between this nurse and patient, however failed, due to patient having technical difficulties OR patient did not have access to video capability.  We continued and completed visit with audio only.  Some vital signs may be absent or patient reported.   Rebecca Wolfe E Jordi Lacko, LPN   Review of Systems     Cardiac Risk Factors include: advanced age (>48men, >60 women);hypertension;dyslipidemia     Objective:    Today's Vitals   03/05/21 1550  Weight: 134 lb (60.8 kg)  Height: 5\' 4"  (1.626 m)   Body mass index is 23 kg/m.  Advanced Directives 03/05/2021 04/03/2019  Does Patient Have a Medical Advance Directive? Yes No  Type of 04/05/2019 of [place];Living will -  Copy of Healthcare Power of Attorney in Chart? No - copy requested -  Would patient like information on creating a medical advance directive? - No - Patient declined    Current Medications (verified) Outpatient Encounter Medications as of 03/05/2021  Medication Sig   celecoxib (CELEBREX) 200 MG capsule TAKE 1 CAPSULE BY MOUTH TWICE A DAY (Patient not taking: Reported on 10/01/2020)   acetaminophen (TYLENOL) 500 MG tablet Take 500 mg by mouth every 6 (six) hours as needed. (Patient not taking:  Reported on 10/01/2020)   atorvastatin (LIPITOR) 40 MG tablet Take 1 tablet (40 mg total) by mouth daily at 6 PM. Appointment needed for future refills.   fluticasone (FLONASE) 50 MCG/ACT nasal spray Place 2 sprays into both nostrils daily. (Patient not taking: Reported on 10/01/2020)   lisinopril-hydrochlorothiazide (ZESTORETIC) 20-12.5 MG tablet Take 2 tablets by mouth daily.   No facility-administered encounter medications on file as of 03/05/2021.    Allergies (verified) Patient has no known allergies.   History: Past Medical History:  Diagnosis Date   Arthritis    Bone spur of right foot    Hyperlipidemia    Hypertension    History reviewed. No pertinent surgical history. Family History  Problem Relation Age of Onset   Stroke Father    Hypertension Father    Hyperlipidemia Sister    Hypertension Sister    Cancer Sister 7       ovarian   Cancer Sister        Breast   Social History   Socioeconomic History   Marital status: Widowed    Spouse name: Not on file   Number of children: 3   Years of education: 10   Highest education level: Not on file  Occupational History   Occupation: Retired  Tobacco Use   Smoking status: Never   Smokeless tobacco: Never  Vaping Use   Vaping Use: Never used  Substance and Sexual Activity   Alcohol use: No   Drug use: No   Sexual activity: Not Currently  Other Topics Concern   Not on file  Social  History Narrative   She lives alone - youngest daughter, Lowella Bandy has colon cancer and she is helping care for her at this time - 03/05/2021   Social Determinants of Health   Financial Resource Strain: Low Risk    Difficulty of Paying Living Expenses: Not hard at all  Food Insecurity: No Food Insecurity   Worried About Programme researcher, broadcasting/film/video in the Last Year: Never true   Barista in the Last Year: Never true  Transportation Needs: Not on file  Physical Activity: Sufficiently Active   Days of Exercise per Week: 7 days   Minutes  of Exercise per Session: 60 min  Stress: No Stress Concern Present   Feeling of Stress : Only a little  Social Connections: Socially Isolated   Frequency of Communication with Friends and Family: More than three times a week   Frequency of Social Gatherings with Friends and Family: More than three times a week   Attends Religious Services: Never   Database administrator or Organizations: No   Attends Banker Meetings: Never   Marital Status: Widowed    Tobacco Counseling Counseling given: Not Answered   Clinical Intake:  Pre-visit preparation completed: Yes  Pain : No/denies pain     BMI - recorded: 23 Nutritional Status: BMI of 19-24  Normal Nutritional Risks: None Diabetes: No  How often do you need to have someone help you when you read instructions, pamphlets, or other written materials from your doctor or pharmacy?: 1 - Never  Diabetic? No  Interpreter Needed?: No  Information entered by :: Chi Woodham, LPN   Activities of Daily Living In your present state of health, do you have any difficulty performing the following activities: 03/05/2021 10/01/2020  Hearing? N N  Vision? N N  Difficulty concentrating or making decisions? N N  Walking or climbing stairs? N N  Dressing or bathing? N N  Doing errands, shopping? N N  Preparing Food and eating ? N -  Using the Toilet? N -  In the past six months, have you accidently leaked urine? N -  Do you have problems with loss of bowel control? N -  Managing your Medications? N -  Managing your Finances? N -  Housekeeping or managing your Housekeeping? N -  Some recent data might be hidden    Patient Care Team: Bennie Pierini, FNP as PCP - General (Nurse Practitioner)  Indicate any recent Medical Services you may have received from other than [facility] providers in the past year (date may be approximate).     Assessment:   This is a routine wellness examination for Rebecca Wolfe.  Hearing/Vision  screen Hearing Screening - Comments:: Denies hearing difficulties Vision Screening - Comments:: Denies vision difficulties - no eye doctor  Dietary issues and exercise activities discussed: Current Exercise Habits: Home exercise routine, Type of exercise: walking;strength training/weights, Time (Minutes): 60, Frequency (Times/Week): 7, Weekly Exercise (Minutes/Week): 420, Intensity: Moderate, Exercise limited by: None identified   Goals Addressed             This Visit's Progress    AWV Goals   On track    04/03/2019 AWV Goal: Fall Prevention  Over the next year, patient will decrease their risk for falls by: Using assistive devices, such as a cane or walker, as needed Identifying fall risks within their home and correcting them by: Removing throw rugs Adding handrails to stairs or ramps Removing clutter and keeping a clear pathway throughout the  home Increasing light, especially at night Adding shower handles/bars Raising toilet seat Identifying potential personal risk factors for falls: Medication side effects Incontinence/urgency Vestibular dysfunction Hearing loss Musculoskeletal disorders Neurological disorders Orthostatic hypotension       DIET - INCREASE WATER INTAKE   On track    Increase water intake to 6 glasses of water a day       Depression Screen PHQ 2/9 Scores 03/05/2021 10/01/2020 01/06/2020 10/01/2019 04/03/2019 04/02/2019 08/20/2018  PHQ - 2 Score 0 0 0 0 0 0 0    Fall Risk Fall Risk  03/05/2021 10/01/2020 01/06/2020 10/01/2019 04/03/2019  Falls in the past year? 1 0 0 0 0  Number falls in past yr: 0 - - - -  Injury with Fall? 0 - - - -  Risk for fall due to : No Fall Risks - - - -  Follow up Falls prevention discussed - - - Falls evaluation completed;Falls prevention discussed    FALL RISK PREVENTION PERTAINING TO THE HOME:  Any stairs in or around the home? Yes  If so, are there any without handrails? No  Home free of loose throw rugs in walkways,  pet beds, electrical cords, etc? Yes  Adequate lighting in your home to reduce risk of falls? Yes   ASSISTIVE DEVICES UTILIZED TO PREVENT FALLS:  Life alert? No  Use of a cane, walker or w/c? No  Grab bars in the bathroom? Yes  Shower chair or bench in shower? Yes  Elevated toilet seat or a handicapped toilet? No   TIMED UP AND GO:  Was the test performed? No . Telephonic visit  Cognitive Function: She declined this today.  MMSE - Mini Mental State Exam 03/05/2021 11/15/2017 11/15/2017  Not completed: Refused - Unable to complete  Orientation to time - 5 5  Orientation to Place - 5 5  Registration - 3 3  Attention/ Calculation - - 0  Attention/Calculation-comments - - refused  Recall - 3 3  Language- name 2 objects - 2 2  Language- repeat - 1 1  Language- follow 3 step command - 3 3  Language- read & follow direction - 1 1  Write a sentence - 1 1  Copy design - 1 1  Total score - - 25     6CIT Screen 04/03/2019  What Year? 0 points  What month? 0 points  What time? 0 points  Count back from 20 4 points  Months in reverse 4 points  Repeat phrase 2 points  Total Score 10    Immunizations Immunization History  Administered Date(s) Administered   Fluad Quad(high Dose 65+) 04/02/2019   Influenza, High Dose Seasonal PF 04/18/2017   Influenza,inj,Quad PF,6+ Mos 04/24/2013, 04/25/2013, 06/30/2014, 03/31/2015   Moderna Sars-Covid-2 Vaccination 06/19/2020   Pneumococcal Conjugate-13 10/01/2020   Pneumococcal Polysaccharide-23 04/21/2009   Td 04/21/2009    TDAP status: Due, Education has been provided regarding the importance of this vaccine. Advised may receive this vaccine at local pharmacy or Health Dept. Aware to provide a copy of the vaccination record if obtained from local pharmacy or Health Dept. Verbalized acceptance and understanding.  Flu Vaccine status: Due, Education has been provided regarding the importance of this vaccine. Advised may receive this vaccine  at local pharmacy or Health Dept. Aware to provide a copy of the vaccination record if obtained from local pharmacy or Health Dept. Verbalized acceptance and understanding.  Pneumococcal vaccine status: Up to date  Covid-19 vaccine status: Information provided on how to  obtain vaccines.   Qualifies for Shingles Vaccine? Yes   Zostavax completed No   Shingrix Completed?: No.    Education has been provided regarding the importance of this vaccine. Patient has been advised to call insurance company to determine out of pocket expense if they have not yet received this vaccine. Advised may also receive vaccine at local pharmacy or Health Dept. Verbalized acceptance and understanding.  Screening Tests Health Maintenance  Topic Date Due   Zoster Vaccines- Shingrix (1 of 2) Never done   COVID-19 Vaccine (2 - Moderna series) 07/17/2020   INFLUENZA VACCINE  01/04/2021   TETANUS/TDAP  04/02/2021 (Originally 04/22/2019)   DEXA SCAN  10/01/2021 (Originally 10/27/2008)   Hepatitis C Screening  Completed   HPV VACCINES  Aged Out    Health Maintenance  Health Maintenance Due  Topic Date Due   Zoster Vaccines- Shingrix (1 of 2) Never done   COVID-19 Vaccine (2 - Moderna series) 07/17/2020   INFLUENZA VACCINE  01/04/2021    Colorectal cancer screening: No longer required.   Mammogram status: No longer required due to age and she declined.  Bone Density Scan: Declined  Lung Cancer Screening: (Low Dose CT Chest recommended if Age 37-80 years, 30 pack-year currently smoking OR have quit w/in 15years.) does not qualify.   Additional Screening:  Hepatitis C Screening: does qualify; Completed 02/02/2016  Vision Screening: Recommended annual ophthalmology exams for early detection of glaucoma and other disorders of the eye. Is the patient up to date with their annual eye exam?  No  Who is the provider or what is the name of the office in which the patient attends annual eye exams? none If pt is  not established with a provider, would they like to be referred to a provider to establish care? No .   Dental Screening: Recommended annual dental exams for proper oral hygiene  Community Resource Referral / Chronic Care Management: CRR required this visit?  No   CCM required this visit?  No      Plan:     I have personally reviewed and noted the following in the patient's chart:   Medical and social history Use of alcohol, tobacco or illicit drugs  Current medications and supplements including opioid prescriptions.  Functional ability and status Nutritional status Physical activity Advanced directives List of other physicians Hospitalizations, surgeries, and ER visits in previous 12 months Vitals Screenings to include cognitive, depression, and falls Referrals and appointments  In addition, I have reviewed and discussed with patient certain preventive protocols, quality metrics, and best practice recommendations. A written personalized care plan for preventive services as well as general preventive health recommendations were provided to patient.     [place] Constable, LPN   [id number]   Nurse Notes: None

## 2021-04-02 ENCOUNTER — Other Ambulatory Visit: Payer: Self-pay

## 2021-04-02 ENCOUNTER — Encounter: Payer: Self-pay | Admitting: Nurse Practitioner

## 2021-04-02 ENCOUNTER — Ambulatory Visit (INDEPENDENT_AMBULATORY_CARE_PROVIDER_SITE_OTHER): Payer: Medicare Other | Admitting: Nurse Practitioner

## 2021-04-02 VITALS — BP 116/59 | HR 72 | Temp 98.2°F | Resp 20 | Ht 64.0 in | Wt 128.0 lb

## 2021-04-02 DIAGNOSIS — E782 Mixed hyperlipidemia: Secondary | ICD-10-CM

## 2021-04-02 DIAGNOSIS — Z23 Encounter for immunization: Secondary | ICD-10-CM | POA: Diagnosis not present

## 2021-04-02 DIAGNOSIS — I10 Essential (primary) hypertension: Secondary | ICD-10-CM | POA: Diagnosis not present

## 2021-04-02 LAB — CMP14+EGFR
ALT: 20 IU/L (ref 0–32)
AST: 25 IU/L (ref 0–40)
Albumin/Globulin Ratio: 2.2 (ref 1.2–2.2)
Albumin: 4.9 g/dL — ABNORMAL HIGH (ref 3.7–4.7)
Alkaline Phosphatase: 55 IU/L (ref 44–121)
BUN/Creatinine Ratio: 12 (ref 12–28)
BUN: 14 mg/dL (ref 8–27)
Bilirubin Total: 0.3 mg/dL (ref 0.0–1.2)
CO2: 22 mmol/L (ref 20–29)
Calcium: 10.3 mg/dL (ref 8.7–10.3)
Chloride: 100 mmol/L (ref 96–106)
Creatinine, Ser: 1.16 mg/dL — ABNORMAL HIGH (ref 0.57–1.00)
Globulin, Total: 2.2 g/dL (ref 1.5–4.5)
Glucose: 89 mg/dL (ref 70–99)
Potassium: 5.3 mmol/L — ABNORMAL HIGH (ref 3.5–5.2)
Sodium: 136 mmol/L (ref 134–144)
Total Protein: 7.1 g/dL (ref 6.0–8.5)
eGFR: 49 mL/min/{1.73_m2} — ABNORMAL LOW (ref >59)

## 2021-04-02 LAB — CBC WITH DIFFERENTIAL/PLATELET
Basophils Absolute: 0 10*3/uL (ref 0.0–0.2)
Basos: 1 %
EOS (ABSOLUTE): 0.1 10*3/uL (ref 0.0–0.4)
Eos: 1 %
Hematocrit: 34.1 % (ref 34.0–46.6)
Hemoglobin: 11.3 g/dL (ref 11.1–15.9)
Immature Grans (Abs): 0 10*3/uL (ref 0.0–0.1)
Immature Granulocytes: 0 %
Lymphocytes Absolute: 1.9 10*3/uL (ref 0.7–3.1)
Lymphs: 40 %
MCH: 31.2 pg (ref 26.6–33.0)
MCHC: 33.1 g/dL (ref 31.5–35.7)
MCV: 94 fL (ref 79–97)
Monocytes Absolute: 0.4 10*3/uL (ref 0.1–0.9)
Monocytes: 8 %
Neutrophils Absolute: 2.4 10*3/uL (ref 1.4–7.0)
Neutrophils: 50 %
Platelets: 233 10*3/uL (ref 150–450)
RBC: 3.62 x10E6/uL — ABNORMAL LOW (ref 3.77–5.28)
RDW: 12.3 % (ref 11.7–15.4)
WBC: 4.7 10*3/uL (ref 3.4–10.8)

## 2021-04-02 LAB — LIPID PANEL
Chol/HDL Ratio: 2.3 ratio (ref 0.0–4.4)
Cholesterol, Total: 140 mg/dL (ref 100–199)
HDL: 62 mg/dL (ref >39)
LDL Chol Calc (NIH): 64 mg/dL (ref 0–99)
Triglycerides: 70 mg/dL (ref 0–149)
VLDL Cholesterol Cal: 14 mg/dL (ref 5–40)

## 2021-04-02 MED ORDER — ATORVASTATIN CALCIUM 40 MG PO TABS: 40.0000 mg | ORAL_TABLET | Freq: Every day | ORAL | 1 refills | Status: DC

## 2021-04-02 MED ORDER — LISINOPRIL-HYDROCHLOROTHIAZIDE 20-12.5 MG PO TABS: 2.0000 | ORAL_TABLET | Freq: Every day | ORAL | 1 refills | Status: DC

## 2021-04-02 NOTE — Patient Instructions (Signed)

## 2021-04-02 NOTE — Progress Notes (Signed)
Subjective:    Patient ID: Rebecca Wolfe, female    DOB: [birth year], 75 y.o.   MRN: [id number]   Chief Complaint: .med   HPI:  1. Primary hypertension No c/o chest pain, sob or headache. Does not check blood pressure at home. BP Readings from Last 3 Encounters:  10/01/20 (!) 131/58  01/06/20 (!) 121/60  04/02/19 131/61     2. Mixed hyperlipidemia Does watch diet and exercises daily. Walks a lot. Lab Results  Component Value Date   CHOL 225 (H) 10/01/2020   HDL 63 10/01/2020   LDLCALC 143 (H) 10/01/2020   TRIG 109 10/01/2020   CHOLHDL 3.6 10/01/2020       Outpatient Encounter Medications as of 04/02/2021  Medication Sig   celecoxib (CELEBREX) 200 MG capsule TAKE 1 CAPSULE BY MOUTH TWICE A DAY (Patient not taking: Reported on 10/01/2020)   acetaminophen (TYLENOL) 500 MG tablet Take 500 mg by mouth every 6 (six) hours as needed. (Patient not taking: Reported on 10/01/2020)   atorvastatin (LIPITOR) 40 MG tablet Take 1 tablet (40 mg total) by mouth daily at 6 PM. Appointment needed for future refills.   fluticasone (FLONASE) 50 MCG/ACT nasal spray Place 2 sprays into both nostrils daily. (Patient not taking: Reported on 10/01/2020)   lisinopril-hydrochlorothiazide (ZESTORETIC) 20-12.5 MG tablet Take 2 tablets by mouth daily.   No facility-administered encounter medications on file as of 04/02/2021.    No past surgical history on file.  Family History  Problem Relation Age of Onset   Stroke Father    Hypertension Father    Hyperlipidemia Sister    Hypertension Sister    Cancer Sister 34       ovarian   Cancer Sister        Breast    New complaints: None today  Social history: Lives by herself and her daughters check on her daily  Controlled substance contract: n/a     Review of Systems  Constitutional:  Negative for diaphoresis.  Eyes:  Negative for pain.  Respiratory:  Negative for shortness of breath.   Cardiovascular:  Negative for chest pain,  palpitations and leg swelling.  Gastrointestinal:  Negative for abdominal pain.  Endocrine: Negative for polydipsia.  Skin:  Negative for rash.  Neurological:  Negative for dizziness, weakness and headaches.  Hematological:  Does not bruise/bleed easily.  All other systems reviewed and are negative.     Objective:   Physical Exam Vitals and nursing note reviewed.  Constitutional:      General: She is not in acute distress.    Appearance: Normal appearance. She is well-developed.  HENT:     Head: Normocephalic.     Right Ear: Tympanic membrane normal.     Left Ear: Tympanic membrane normal.     Nose: Nose normal.     Mouth/Throat:     Mouth: Mucous membranes are moist.  Eyes:     Pupils: Pupils are equal, round, and reactive to light.  Neck:     Vascular: No carotid bruit or JVD.  Cardiovascular:     Rate and Rhythm: Normal rate and regular rhythm.     Heart sounds: Normal heart sounds.  Pulmonary:     Effort: Pulmonary effort is normal. No respiratory distress.     Breath sounds: Normal breath sounds. No wheezing or rales.  Chest:     Chest wall: No tenderness.  Abdominal:     General: Bowel sounds are normal. There is no distension or abdominal  bruit.     Palpations: Abdomen is soft. There is no hepatomegaly, splenomegaly, mass or pulsatile mass.     Tenderness: There is no abdominal tenderness.  Musculoskeletal:        General: Normal range of motion.     Cervical back: Normal range of motion and neck supple.  Lymphadenopathy:     Cervical: No cervical adenopathy.  Skin:    General: Skin is warm and dry.  Neurological:     Mental Status: She is alert and oriented to person, place, and time.     Deep Tendon Reflexes: Reflexes are normal and symmetric.  Psychiatric:        Behavior: Behavior normal.        Thought Content: Thought content normal.        Judgment: Judgment normal.    BP (!) 116/59   Pulse 72   Temp 98.2 F (36.8 C) (Temporal)   Resp 20   Ht  _0  (1.626 m)   Wt 128 lb (58.1 kg)   SpO2 98%   BMI 21.97 kg/m        Assessment & Plan:  Rebecca Wolfe comes in today with chief complaint of Medical Management of Chronic Issues   Diagnosis and orders addressed:  1. Primary hypertension Low sodium diet - lisinopril-hydrochlorothiazide (ZESTORETIC) 20-12.5 MG tablet; Take 2 tablets by mouth daily.  Dispense: 180 tablet; Refill: 1 - CBC with Differential/Platelet - CMP14+EGFR  2. Mixed hyperlipidemia Low fat diet - atorvastatin (LIPITOR) 40 MG tablet; Take 1 tablet (40 mg total) by mouth daily at 6 PM. Appointment needed for future refills.  Dispense: 90 tablet; Refill: 1 - Lipid panel   Labs pending Health Maintenance reviewed Diet and exercise encouraged  Follow up plan:  6 months   [place], FNP

## 2021-07-19 ENCOUNTER — Other Ambulatory Visit: Payer: Self-pay | Admitting: Nurse Practitioner

## 2021-07-19 DIAGNOSIS — I10 Essential (primary) hypertension: Secondary | ICD-10-CM

## 2021-09-09 ENCOUNTER — Other Ambulatory Visit: Payer: Self-pay | Admitting: Nurse Practitioner

## 2021-09-09 DIAGNOSIS — I10 Essential (primary) hypertension: Secondary | ICD-10-CM

## 2021-10-01 ENCOUNTER — Encounter: Payer: Self-pay | Admitting: Nurse Practitioner

## 2021-10-01 ENCOUNTER — Ambulatory Visit (INDEPENDENT_AMBULATORY_CARE_PROVIDER_SITE_OTHER): Payer: Medicare Other | Admitting: Nurse Practitioner

## 2021-10-01 VITALS — BP 104/58 | HR 73 | Temp 97.6°F | Resp 20 | Ht 64.0 in | Wt 122.0 lb

## 2021-10-01 DIAGNOSIS — E782 Mixed hyperlipidemia: Secondary | ICD-10-CM

## 2021-10-01 DIAGNOSIS — F411 Generalized anxiety disorder: Secondary | ICD-10-CM | POA: Diagnosis not present

## 2021-10-01 DIAGNOSIS — I10 Essential (primary) hypertension: Secondary | ICD-10-CM | POA: Diagnosis not present

## 2021-10-01 MED ORDER — CITALOPRAM HYDROBROMIDE 20 MG PO TABS: 20.0000 mg | ORAL_TABLET | Freq: Every day | ORAL | 5 refills | Status: DC

## 2021-10-01 NOTE — Progress Notes (Signed)
? ?Subjective:  ? ? Patient ID: Rebecca Wolfe, female    DOB: 05-30-1946, 76 y.o.   MRN: [id number] ? ? ?Chief Complaint: Medical Management of Chronic Issues (Sinus problems/) ?  ? ?HPI: ? ?Rebecca Wolfe is a 76 y.o. who identifies as a female who was assigned female at birth.  ? ?Social history: ?Lives with: lives by herself. Hr daughters check on her daily ?Work history: retired ? ? ?Comes in today for follow up of the following chronic medical issues: ? ?1. Primary hypertension ?No c/o chrest pain, sob or headache. Does not check blood pressure at home. ?BP Readings from Last 3 Encounters:  ?10/01/21 (!) 104/58  ?04/02/21 (!) 116/59  ?10/01/20 (!) 131/58  ? ? ? ?2. Mixed hyperlipidemia ?Does watch diet and walks daily ?Lab Results  ?Component Value Date  ? CHOL 140 04/02/2021  ? HDL 62 04/02/2021  ? [place] 64 04/02/2021  ? TRIG 70 04/02/2021  ? CHOLHDL 2.3 04/02/2021  ? ?The 10-year ASCVD risk score (Arnett DK, et al., 2019) is: 17.8% ? ? ? ?New complaints: ?Patient is having some anxiety. Her daughter just went through cancer , her other daughter moved to [place], and she is having issues with herson. ? ?  10/01/2021  ? 10:34 AM 04/02/2021  ? 10:37 AM  ?GAD 7 : Generalized Anxiety Score  ?Nervous, Anxious, on Edge 0 0  ?Control/stop worrying 0 0  ?Worry too much - different things 0 0  ?Trouble relaxing 1 0  ?Restless 0 0  ?Easily annoyed or irritable 0 0  ?Afraid - awful might happen 0 0  ?Total GAD 7 Score 1 0  ?Anxiety Difficulty Not difficult at all Not difficult at all  ? ? ? ? ?No Known Allergies ?Outpatient Encounter Medications as of 10/01/2021  ?Medication Sig  ? acetaminophen (TYLENOL) 500 MG tablet Take 500 mg by mouth every 6 (six) hours as needed.  ? atorvastatin (LIPITOR) 40 MG tablet Take 1 tablet (40 mg total) by mouth daily at 6 PM. Appointment needed for future refills.  ? celecoxib (CELEBREX) 200 MG capsule TAKE 1 CAPSULE BY MOUTH TWICE A DAY  ? lisinopril-hydrochlorothiazide (ZESTORETIC)  20-12.5 MG tablet TAKE 2 TABLETS BY MOUTH DAILY  ? fluticasone (FLONASE) 50 MCG/ACT nasal spray Place 2 sprays into both nostrils daily. (Patient not taking: Reported on 10/01/2021)  ? ?No facility-administered encounter medications on file as of 10/01/2021.  ? ? ?History reviewed. No pertinent surgical history. ? ?Family History  ?Problem Relation Age of Onset  ? Stroke Father   ? Hypertension Father   ? Hyperlipidemia Sister   ? Hypertension Sister   ? Cancer Sister 61  ?     ovarian  ? Cancer Sister   ?     Breast  ? ? ? ? ?Controlled substance contract: n/a ? ? ? ? ?Review of Systems  ?Constitutional:  Negative for diaphoresis.  ?Eyes:  Negative for pain.  ?Respiratory:  Negative for shortness of breath.   ?Cardiovascular:  Negative for chest pain, palpitations and leg swelling.  ?Gastrointestinal:  Negative for abdominal pain.  ?Endocrine: Negative for polydipsia.  ?Skin:  Negative for rash.  ?Neurological:  Negative for dizziness, weakness and headaches.  ?Hematological:  Does not bruise/bleed easily.  ?All other systems reviewed and are negative. ? ?   ?Objective:  ? Physical Exam ?Vitals and nursing note reviewed.  ?Constitutional:   ?   General: She is not in acute distress. ?   Appearance: Normal  appearance. She is well-developed.  ?HENT:  ?   Head: Normocephalic.  ?   Right Ear: Tympanic membrane normal.  ?   Left Ear: Tympanic membrane normal.  ?   Nose: Nose normal.  ?   Mouth/Throat:  ?   Mouth: Mucous membranes are moist.  ?Eyes:  ?   Pupils: Pupils are equal, round, and reactive to light.  ?Neck:  ?   Vascular: No carotid bruit or JVD.  ?Cardiovascular:  ?   Rate and Rhythm: Normal rate and regular rhythm.  ?   Heart sounds: Normal heart sounds.  ?Pulmonary:  ?   Effort: Pulmonary effort is normal. No respiratory distress.  ?   Breath sounds: Normal breath sounds. No wheezing or rales.  ?Chest:  ?   Chest wall: No tenderness.  ?Abdominal:  ?   General: Bowel sounds are normal. There is no distension or  abdominal bruit.  ?   Palpations: Abdomen is soft. There is no hepatomegaly, splenomegaly, mass or pulsatile mass.  ?   Tenderness: There is no abdominal tenderness.  ?Musculoskeletal:     ?   General: Normal range of motion.  ?   Cervical back: Normal range of motion and neck supple.  ?Lymphadenopathy:  ?   Cervical: No cervical adenopathy.  ?Skin: ?   General: Skin is warm and dry.  ?Neurological:  ?   Mental Status: She is alert and oriented to person, place, and time.  ?   Deep Tendon Reflexes: Reflexes are normal and symmetric.  ?Psychiatric:     ?   Behavior: Behavior normal.     ?   Thought Content: Thought content normal.     ?   Judgment: Judgment normal.  ? ? ?BP (!) 104/58   Pulse 73   Temp 97.6 ?F (36.4 ?C) (Temporal)   Resp 20   Ht _0  (1.626 m)   Wt 122 lb (55.3 kg)   SpO2 99%   BMI 20.94 kg/m?  ? ? ? ?   ?Assessment & Plan:  ?Rebecca Wolfe comes in today with chief complaint of Medical Management of Chronic Issues (Sinus problems/) ? ? ?Diagnosis and orders addressed: ? ?1. Primary hypertension ?Low sodium diet ?- CBC with Differential/Platelet ?- CMP14+EGFR ? ?2. Mixed hyperlipidemia ?Low fat diet ?- Lipid panel ? ?3. GAD (generalized anxiety disorder) ?Stress management ?- citalopram (CELEXA) 20 MG tablet; Take 1 tablet (20 mg total) by mouth daily.  Dispense: 30 tablet; Refill: 5 ? ? ?Labs pending ?Health Maintenance reviewed ?Diet and exercise encouraged ? ?Follow up plan: ?6 months ? ? ?Mary-Margaret Hassell Done, FNP ? ? ?

## 2021-10-01 NOTE — Patient Instructions (Signed)

## 2021-10-21 ENCOUNTER — Other Ambulatory Visit: Payer: Self-pay | Admitting: Nurse Practitioner

## 2021-10-21 ENCOUNTER — Telehealth: Payer: Self-pay | Admitting: Nurse Practitioner

## 2021-10-21 DIAGNOSIS — E782 Mixed hyperlipidemia: Secondary | ICD-10-CM

## 2021-10-21 DIAGNOSIS — I10 Essential (primary) hypertension: Secondary | ICD-10-CM

## 2021-10-21 DIAGNOSIS — M19049 Primary osteoarthritis, unspecified hand: Secondary | ICD-10-CM

## 2021-10-21 NOTE — Telephone Encounter (Signed)
  Prescription Request  10/21/2021  Is this a "Controlled Substance" medicine? no  Have you seen your PCP in the last 2 weeks? no  If YES, route message to pool  -  If NO, patient needs to be scheduled for appointment.  What is the name of the medication or equipment? celecoxib (CELEBREX) 200 MG capsule and lisinopril-hydrochlorothiazide (ZESTORETIC) 20-12.5 MG tablet  Have you contacted your pharmacy to request a refill? yes   Which pharmacy would you like this sent to? [facility] in [place]    Patient notified that their request is being sent to the clinical staff for review and that they should receive a response within 2 business days.

## 2021-10-21 NOTE — Telephone Encounter (Signed)
NA/NVM, Pt's Lisinopril-HCTZ was filled on 09/09/21 for #90 refill for another 90d was sent to pharmacy. Celexoib request was sent to PCP. Pharmacy sent request for Atorvastatin which was refilled as well.

## 2021-10-22 MED ORDER — CELECOXIB 200 MG PO CAPS: 200.0000 mg | ORAL_CAPSULE | Freq: Two times a day (BID) | ORAL | 3 refills | Status: DC

## 2021-10-22 NOTE — Telephone Encounter (Signed)
Refill failed. resent °

## 2021-10-24 ENCOUNTER — Other Ambulatory Visit: Payer: Self-pay | Admitting: Nurse Practitioner

## 2021-10-24 DIAGNOSIS — F411 Generalized anxiety disorder: Secondary | ICD-10-CM

## 2021-11-14 ENCOUNTER — Other Ambulatory Visit: Payer: Self-pay | Admitting: Nurse Practitioner

## 2021-11-14 DIAGNOSIS — K529 Noninfective gastroenteritis and colitis, unspecified: Secondary | ICD-10-CM | POA: Diagnosis not present

## 2021-11-14 DIAGNOSIS — I10 Essential (primary) hypertension: Secondary | ICD-10-CM

## 2021-11-14 DIAGNOSIS — E860 Dehydration: Secondary | ICD-10-CM | POA: Diagnosis not present

## 2021-11-25 ENCOUNTER — Encounter: Payer: Self-pay | Admitting: Physician Assistant

## 2021-11-25 ENCOUNTER — Ambulatory Visit (INDEPENDENT_AMBULATORY_CARE_PROVIDER_SITE_OTHER): Payer: Medicare Other | Admitting: Physician Assistant

## 2021-11-25 VITALS — BP 90/50 | HR 98 | Temp 97.5°F | Ht 64.0 in | Wt 107.8 lb

## 2021-11-25 DIAGNOSIS — R634 Abnormal weight loss: Secondary | ICD-10-CM

## 2021-11-25 LAB — MICROSCOPIC EXAMINATION: Bacteria, UA: NONE SEEN

## 2021-11-25 LAB — URINALYSIS, COMPLETE
Bilirubin, UA: NEGATIVE
Glucose, UA: NEGATIVE
Leukocytes,UA: NEGATIVE
Nitrite, UA: NEGATIVE
Protein,UA: NEGATIVE
RBC, UA: NEGATIVE
Specific Gravity, UA: >1.03 — ABNORMAL HIGH (ref 1.005–1.030)
Urobilinogen, Ur: 0.2 mg/dL (ref 0.2–1.0)
pH, UA: 5 (ref 5.0–7.5)

## 2021-11-25 NOTE — Patient Instructions (Signed)
Fatigue If you have fatigue, you feel tired all the time and have a lack of energy or a lack of motivation. Fatigue may make it difficult to start or complete tasks because of exhaustion. Occasional or mild fatigue is often a normal response to activity or life. However, long-term (chronic) or extreme fatigue may be a symptom of a medical condition such as: Depression. Not having enough red blood cells or hemoglobin in the blood (anemia). A problem with a small gland located in the lower front part of the neck (thyroid disorder). Rheumatologic conditions. These are problems related to the body's defense system (immune system). Infections, especially certain viral infections. Fatigue can also lead to negative health outcomes over time. Follow these instructions at home: Medicines Take over-the-counter and prescription medicines only as told by your health care provider. Take a multivitamin if told by your health care provider. Do not use herbal or dietary supplements unless they are approved by your health care provider. Eating and drinking  Avoid heavy meals in the evening. Eat a well-balanced diet, which includes lean proteins, whole grains, plenty of fruits and vegetables, and low-fat dairy products. Avoid eating or drinking too many products with caffeine in them. Avoid alcohol. Drink enough fluid to keep your urine pale yellow. Activity  Exercise regularly, as told by your health care provider. Use or practice techniques to help you relax, such as yoga, tai chi, meditation, or massage therapy. Lifestyle Change situations that cause you stress. Try to keep your work and personal schedules in balance. Do not use recreational or illegal drugs. General instructions Monitor your fatigue for any changes. Go to bed and get up at the same time every day. Avoid fatigue by pacing yourself during the day and getting enough sleep at night. Maintain a healthy weight. Contact a health care  provider if: Your fatigue does not get better. You have a fever. You suddenly lose or gain weight. You have headaches. You have trouble falling asleep or sleeping through the night. You feel angry, guilty, anxious, or sad. You have swelling in your legs or another part of your body. Get help right away if: You feel confused, feel like you might faint, or faint. Your vision is blurry or you have a severe headache. You have severe pain in your abdomen, your back, or the area between your waist and hips (pelvis). You have chest pain, shortness of breath, or an irregular or fast heartbeat. You are unable to urinate, or you urinate less than normal. You have abnormal bleeding from the rectum, nose, lungs, nipples, or, if you are female, the vagina. You vomit blood. You have thoughts about hurting yourself or others. These symptoms may be an emergency. Get help right away. Call 911. Do not wait to see if the symptoms will go away. Do not drive yourself to the hospital. Get help right away if you feel like you may hurt yourself or others, or have thoughts about taking your own life. Go to your nearest emergency room or: Call 911. Call the [facility] at [phone] or 988. This is open 24 hours a day. Text the Crisis Text Line at 979-823-0385. Summary If you have fatigue, you feel tired all the time and have a lack of energy or a lack of motivation. Fatigue may make it difficult to start or complete tasks because of exhaustion. Long-term (chronic) or extreme fatigue may be a symptom of a medical condition. Exercise regularly, as told by your health care provider.  Change situations that cause you stress. Try to keep your work and personal schedules in balance. This information is not intended to replace advice given to you by your health care provider. Make sure you discuss any questions you have with your health care provider. Document Revised: 03/15/2021 Document  Reviewed: 03/15/2021 Elsevier Patient Education  2023 [employer] Inc[employer]  

## 2021-11-26 LAB — CMP14+EGFR
ALT: 22 IU/L (ref 0–32)
AST: 25 IU/L (ref 0–40)
Albumin/Globulin Ratio: 2.4 — ABNORMAL HIGH (ref 1.2–2.2)
Albumin: 4.8 g/dL — ABNORMAL HIGH (ref 3.7–4.7)
Alkaline Phosphatase: 44 IU/L (ref 44–121)
BUN/Creatinine Ratio: 15 (ref 12–28)
BUN: 51 mg/dL — ABNORMAL HIGH (ref 8–27)
Bilirubin Total: 0.4 mg/dL (ref 0.0–1.2)
CO2: 15 mmol/L — ABNORMAL LOW (ref 20–29)
Calcium: 9.9 mg/dL (ref 8.7–10.3)
Chloride: 96 mmol/L (ref 96–106)
Creatinine, Ser: 3.47 mg/dL (ref 0.57–1.00)
Globulin, Total: 2 g/dL (ref 1.5–4.5)
Glucose: 88 mg/dL (ref 70–99)
Potassium: 4.8 mmol/L (ref 3.5–5.2)
Sodium: 134 mmol/L (ref 134–144)
Total Protein: 6.8 g/dL (ref 6.0–8.5)
eGFR: 13 mL/min/{1.73_m2} — ABNORMAL LOW (ref >59)

## 2021-11-26 LAB — CBC WITH DIFFERENTIAL/PLATELET
Basophils Absolute: 0 10*3/uL (ref 0.0–0.2)
Basos: 0 %
EOS (ABSOLUTE): 0 10*3/uL (ref 0.0–0.4)
Eos: 1 %
Hematocrit: 36.3 % (ref 34.0–46.6)
Hemoglobin: 12.3 g/dL (ref 11.1–15.9)
Immature Grans (Abs): 0 10*3/uL (ref 0.0–0.1)
Immature Granulocytes: 0 %
Lymphocytes Absolute: 3.2 10*3/uL — ABNORMAL HIGH (ref 0.7–3.1)
Lymphs: 38 %
MCH: 31.1 pg (ref 26.6–33.0)
MCHC: 33.9 g/dL (ref 31.5–35.7)
MCV: 92 fL (ref 79–97)
Monocytes Absolute: 0.5 10*3/uL (ref 0.1–0.9)
Monocytes: 6 %
Neutrophils Absolute: 4.5 10*3/uL (ref 1.4–7.0)
Neutrophils: 55 %
Platelets: 245 10*3/uL (ref 150–450)
RBC: 3.96 x10E6/uL (ref 3.77–5.28)
RDW: 13 % (ref 11.7–15.4)
WBC: 8.3 10*3/uL (ref 3.4–10.8)

## 2021-11-27 ENCOUNTER — Inpatient Hospital Stay (HOSPITAL_COMMUNITY): Payer: Medicare Other

## 2021-11-27 ENCOUNTER — Inpatient Hospital Stay (HOSPITAL_COMMUNITY)
Admission: EM | Admit: 2021-11-27 | Discharge: 2021-11-29 | DRG: 684 | Disposition: A | Discharge status: Home / Self Care | Payer: Medicare Other | Attending: Family Medicine | Admitting: Family Medicine

## 2021-11-27 ENCOUNTER — Encounter (HOSPITAL_COMMUNITY): Payer: Self-pay

## 2021-11-27 ENCOUNTER — Other Ambulatory Visit: Payer: Self-pay

## 2021-11-27 DIAGNOSIS — R109 Unspecified abdominal pain: Secondary | ICD-10-CM

## 2021-11-27 DIAGNOSIS — Z79899 Other long term (current) drug therapy: Secondary | ICD-10-CM

## 2021-11-27 DIAGNOSIS — Z8041 Family history of malignant neoplasm of ovary: Secondary | ICD-10-CM

## 2021-11-27 DIAGNOSIS — R112 Nausea with vomiting, unspecified: Secondary | ICD-10-CM

## 2021-11-27 DIAGNOSIS — N179 Acute kidney failure, unspecified: Secondary | ICD-10-CM | POA: Diagnosis not present

## 2021-11-27 DIAGNOSIS — N170 Acute kidney failure with tubular necrosis: Principal | ICD-10-CM | POA: Diagnosis present

## 2021-11-27 DIAGNOSIS — E860 Dehydration: Secondary | ICD-10-CM | POA: Diagnosis not present

## 2021-11-27 DIAGNOSIS — R531 Weakness: Secondary | ICD-10-CM | POA: Diagnosis not present

## 2021-11-27 DIAGNOSIS — Z83438 Family history of other disorder of lipoprotein metabolism and other lipidemia: Secondary | ICD-10-CM

## 2021-11-27 DIAGNOSIS — I10 Essential (primary) hypertension: Secondary | ICD-10-CM | POA: Diagnosis not present

## 2021-11-27 DIAGNOSIS — M1990 Unspecified osteoarthritis, unspecified site: Secondary | ICD-10-CM | POA: Diagnosis not present

## 2021-11-27 DIAGNOSIS — I959 Hypotension, unspecified: Secondary | ICD-10-CM

## 2021-11-27 DIAGNOSIS — Z823 Family history of stroke: Secondary | ICD-10-CM

## 2021-11-27 DIAGNOSIS — Z91013 Allergy to seafood: Secondary | ICD-10-CM

## 2021-11-27 DIAGNOSIS — A084 Viral intestinal infection, unspecified: Secondary | ICD-10-CM | POA: Diagnosis present

## 2021-11-27 DIAGNOSIS — E785 Hyperlipidemia, unspecified: Secondary | ICD-10-CM | POA: Diagnosis not present

## 2021-11-27 DIAGNOSIS — Z8249 Family history of ischemic heart disease and other diseases of the circulatory system: Secondary | ICD-10-CM | POA: Diagnosis not present

## 2021-11-27 DIAGNOSIS — F419 Anxiety disorder, unspecified: Secondary | ICD-10-CM | POA: Insufficient documentation

## 2021-11-27 DIAGNOSIS — Z803 Family history of malignant neoplasm of breast: Secondary | ICD-10-CM | POA: Diagnosis not present

## 2021-11-27 LAB — URINALYSIS, ROUTINE W REFLEX MICROSCOPIC
Bacteria, UA: NONE SEEN
Bilirubin Urine: NEGATIVE
Glucose, UA: NEGATIVE mg/dL
Ketones, ur: 5 mg/dL — AB
Nitrite: NEGATIVE
Protein, ur: NEGATIVE mg/dL
Specific Gravity, Urine: 1.008 (ref 1.005–1.030)
pH: 5 (ref 5.0–8.0)

## 2021-11-27 LAB — COMPREHENSIVE METABOLIC PANEL
ALT: 21 U/L (ref 0–44)
AST: 24 U/L (ref 15–41)
Albumin: 4.6 g/dL (ref 3.5–5.0)
Alkaline Phosphatase: 35 U/L — ABNORMAL LOW (ref 38–126)
Anion gap: 15 (ref 5–15)
BUN: 60 mg/dL — ABNORMAL HIGH (ref 8–23)
CO2: 17 mmol/L — ABNORMAL LOW (ref 22–32)
Calcium: 9.9 mg/dL (ref 8.9–10.3)
Chloride: 102 mmol/L (ref 98–111)
Creatinine, Ser: 3.04 mg/dL — ABNORMAL HIGH (ref 0.44–1.00)
GFR, Estimated: 15 mL/min — ABNORMAL LOW (ref >60)
Glucose, Bld: 108 mg/dL — ABNORMAL HIGH (ref 70–99)
Potassium: 4.1 mmol/L (ref 3.5–5.1)
Sodium: 134 mmol/L — ABNORMAL LOW (ref 135–145)
Total Bilirubin: 0.9 mg/dL (ref 0.3–1.2)
Total Protein: 7.3 g/dL (ref 6.5–8.1)

## 2021-11-27 LAB — CBC WITH DIFFERENTIAL/PLATELET
Abs Immature Granulocytes: 0.02 10*3/uL (ref 0.00–0.07)
Basophils Absolute: 0 10*3/uL (ref 0.0–0.1)
Basophils Relative: 0 %
Eosinophils Absolute: 0 10*3/uL (ref 0.0–0.5)
Eosinophils Relative: 1 %
HCT: 36.4 % (ref 36.0–46.0)
Hemoglobin: 12.3 g/dL (ref 12.0–15.0)
Immature Granulocytes: 0 %
Lymphocytes Relative: 30 %
Lymphs Abs: 1.7 10*3/uL (ref 0.7–4.0)
MCH: 31.5 pg (ref 26.0–34.0)
MCHC: 33.8 g/dL (ref 30.0–36.0)
MCV: 93.3 fL (ref 80.0–100.0)
Monocytes Absolute: 0.3 10*3/uL (ref 0.1–1.0)
Monocytes Relative: 6 %
Neutro Abs: 3.7 10*3/uL (ref 1.7–7.7)
Neutrophils Relative %: 63 %
Platelets: 179 10*3/uL (ref 150–400)
RBC: 3.9 MIL/uL (ref 3.87–5.11)
RDW: 13.3 % (ref 11.5–15.5)
WBC: 5.8 10*3/uL (ref 4.0–10.5)
nRBC: 0 % (ref 0.0–0.2)

## 2021-11-27 LAB — LIPASE, BLOOD: Lipase: 57 U/L — ABNORMAL HIGH (ref 11–51)

## 2021-11-27 MED ORDER — ACETAMINOPHEN 650 MG RE SUPP: 650.0000 mg | Freq: Four times a day (QID) | RECTAL | Status: DC | PRN

## 2021-11-27 MED ORDER — PANTOPRAZOLE SODIUM 40 MG PO TBEC
40.0000 mg | DELAYED_RELEASE_TABLET | Freq: Every day | ORAL | Status: DC
  Administered 2021-11-27 – 2021-11-29 (×3): 40 mg via ORAL
  Filled 2021-11-27 (×3): qty 1

## 2021-11-27 MED ORDER — PROCHLORPERAZINE EDISYLATE 10 MG/2ML IJ SOLN: 10.0000 mg | Freq: Four times a day (QID) | INTRAMUSCULAR | Status: DC | PRN

## 2021-11-27 MED ORDER — ACETAMINOPHEN 325 MG PO TABS: 650.0000 mg | ORAL_TABLET | Freq: Four times a day (QID) | ORAL | Status: DC | PRN

## 2021-11-27 MED ORDER — SODIUM CHLORIDE 0.9 % IV BOLUS
1000.0000 mL | Freq: Once | INTRAVENOUS | Status: AC
  Administered 2021-11-27: 1000 mL via INTRAVENOUS

## 2021-11-27 MED ORDER — OXYCODONE HCL 5 MG PO TABS: 5.0000 mg | ORAL_TABLET | ORAL | Status: DC | PRN

## 2021-11-27 MED ORDER — SODIUM CHLORIDE 0.9 % IV SOLN: INTRAVENOUS | Status: DC

## 2021-11-27 MED ORDER — ORAL CARE MOUTH RINSE: 15.0000 mL | OROMUCOSAL | Status: DC | PRN

## 2021-11-27 MED ORDER — FLUTICASONE PROPIONATE 50 MCG/ACT NA SUSP
2.0000 | Freq: Two times a day (BID) | NASAL | Status: DC | PRN
Start: 2021-11-27 — End: 2021-11-29

## 2021-11-27 MED ORDER — ONDANSETRON HCL 4 MG/2ML IJ SOLN
4.0000 mg | Freq: Once | INTRAMUSCULAR | Status: AC
  Administered 2021-11-27: 4 mg via INTRAVENOUS
  Filled 2021-11-27: qty 2

## 2021-11-28 DIAGNOSIS — N179 Acute kidney failure, unspecified: Secondary | ICD-10-CM | POA: Diagnosis not present

## 2021-11-28 LAB — LIPID PANEL
Cholesterol: 120 mg/dL (ref 0–200)
HDL: 49 mg/dL (ref >40)
LDL Cholesterol: 55 mg/dL (ref 0–99)
Total CHOL/HDL Ratio: 2.4 RATIO
Triglycerides: 79 mg/dL (ref <150)
VLDL: 16 mg/dL (ref 0–40)

## 2021-11-28 LAB — COMPREHENSIVE METABOLIC PANEL
ALT: 16 U/L (ref 0–44)
AST: 18 U/L (ref 15–41)
Albumin: 3.2 g/dL — ABNORMAL LOW (ref 3.5–5.0)
Alkaline Phosphatase: 25 U/L — ABNORMAL LOW (ref 38–126)
Anion gap: 6 (ref 5–15)
BUN: 35 mg/dL — ABNORMAL HIGH (ref 8–23)
CO2: 20 mmol/L — ABNORMAL LOW (ref 22–32)
Calcium: 8.5 mg/dL — ABNORMAL LOW (ref 8.9–10.3)
Chloride: 115 mmol/L — ABNORMAL HIGH (ref 98–111)
Creatinine, Ser: 1.61 mg/dL — ABNORMAL HIGH (ref 0.44–1.00)
GFR, Estimated: 33 mL/min — ABNORMAL LOW (ref >60)
Glucose, Bld: 99 mg/dL (ref 70–99)
Potassium: 4.2 mmol/L (ref 3.5–5.1)
Sodium: 141 mmol/L (ref 135–145)
Total Bilirubin: 0.7 mg/dL (ref 0.3–1.2)
Total Protein: 5.4 g/dL — ABNORMAL LOW (ref 6.5–8.1)

## 2021-11-28 LAB — CBC
HCT: 29.7 % — ABNORMAL LOW (ref 36.0–46.0)
Hemoglobin: 9.8 g/dL — ABNORMAL LOW (ref 12.0–15.0)
MCH: 31.7 pg (ref 26.0–34.0)
MCHC: 33 g/dL (ref 30.0–36.0)
MCV: 96.1 fL (ref 80.0–100.0)
Platelets: 126 10*3/uL — ABNORMAL LOW (ref 150–400)
RBC: 3.09 MIL/uL — ABNORMAL LOW (ref 3.87–5.11)
RDW: 13.2 % (ref 11.5–15.5)
WBC: 3.6 10*3/uL — ABNORMAL LOW (ref 4.0–10.5)
nRBC: 0 % (ref 0.0–0.2)

## 2021-11-28 MED ORDER — ENOXAPARIN SODIUM 30 MG/0.3ML IJ SOSY
30.0000 mg | PREFILLED_SYRINGE | Freq: Every day | INTRAMUSCULAR | Status: DC
  Administered 2021-11-28 – 2021-11-29 (×2): 30 mg via SUBCUTANEOUS
  Filled 2021-11-28 (×2): qty 0.3

## 2021-11-28 NOTE — Evaluation (Signed)
Physical Therapy Evaluation Patient Details Name: Rebecca Wolfe MRN: [id number] DOB: 04/20/1946 Today's Date: 11/28/2021  History of Present Illness  Rebecca Wolfe is a 76 y.o. female wtih c/o generalized weakness and poor PO intake, adm with AKI, hypotension.  PMH: HTN, HLD, anxiety.  Clinical Impression  Patient evaluated by Physical Therapy with no further acute PT needs identified. All education has been completed and the patient has no further questions.  Pt amb 280' with RW, supervision. Pt is able to amb short distance without device. Pt denied dizziness or other symptoms while amb. BP 109/72 after amb. Encouraged pt to use RW for safety initially. Ok to walk with staff or dtr as long as she remains asymptomatic.  See below for any follow-up Physical Therapy or equipment needs. PT is signing off. Thank you for this referral.        Recommendations for follow up therapy are one component of a multi-disciplinary discharge planning process, led by the attending physician.  Recommendations may be updated based on patient status, additional functional criteria and insurance authorization.  Follow Up Recommendations No PT follow up      Assistance Recommended at Discharge PRN  Patient can return home with the following  Help with stairs or ramp for entrance    Equipment Recommendations None recommended by PT  Recommendations for Other Services       Functional Status Assessment Patient has had a recent decline in their functional status and demonstrates the ability to make significant improvements in function in a reasonable and predictable amount of time.     Precautions / Restrictions Precautions Precaution Comments: soft BP Restrictions Weight Bearing Restrictions: No      Mobility  Bed Mobility               General bed mobility comments: in recliner    Transfers Overall transfer level: Needs assistance Equipment used: Rolling walker (2 wheels),  None Transfers: Sit to/from Stand Sit to Stand: Supervision           General transfer comment: for safety    Ambulation/Gait Ambulation/Gait assistance: Supervision, Min guard Gait Distance (Feet): 280 Feet Assistive device: Rolling walker (2 wheels) Gait Pattern/deviations: Step-through pattern       General Gait Details: good stability with RW, brief LOB x1 with head turns but independent recovery. pt denies any dizziness.  Stairs            Wheelchair Mobility    Modified Rankin (Stroke Patients Only)       Balance   Sitting-balance support: Feet supported, No upper extremity supported Sitting balance-Leahy Scale: Normal     Standing balance support: No upper extremity supported, During functional activity, Reliant on assistive device for balance Standing balance-Leahy Scale: Fair Standing balance comment: lightly reliant on RW; able to stand and reach outside BOS with close guarding and no UE support             High level balance activites: Side stepping, Direction changes, Turns, Head turns High Level Balance Comments: LOB 1, self recovery             Pertinent Vitals/Pain Pain Assessment Pain Assessment: No/denies pain    Home Living Family/patient expects to be discharged to:: Private residence Living Arrangements: Alone Available Help at Discharge: Family Type of Home: House         Home Layout: One level Home Equipment: Agricultural consultant (2 wheels) Additional Comments: dtr lives nearby, supportive    Prior Function  Prior Level of Function : Independent/Modified Independent             Mobility Comments: pt reports being quite active at her baseline, walks ~ 49mi/day-5 days per week. shorter distances on the wknd       Hand Dominance        Extremity/Trunk Assessment   Upper Extremity Assessment Upper Extremity Assessment: Overall WFL for tasks assessed    Lower Extremity Assessment Lower Extremity Assessment:  Overall WFL for tasks assessed       Communication      Cognition Arousal/Alertness: Awake/alert Behavior During Therapy: WFL for tasks assessed/performed Overall Cognitive Status: Within Functional Limits for tasks assessed                                 General Comments: occasionally saying things that were slight off--asked dtr if she had "fun" (dtr had gone home to shower);  Ox3, follows commands consistently        General Comments      Exercises     Assessment/Plan    PT Assessment Patient does not need any further PT services  PT Problem List         PT Treatment Interventions      PT Goals (Current goals can be found in the Care Plan section)  Acute Rehab PT Goals Patient Stated Goal: home very soon PT Goal Formulation: All assessment and education complete, DC therapy    Frequency       Co-evaluation               AM-PAC PT "6 Clicks" Mobility  Outcome Measure Help needed turning from your back to your side while in a flat bed without using bedrails?: None Help needed moving from lying on your back to sitting on the side of a flat bed without using bedrails?: None Help needed moving to and from a bed to a chair (including a wheelchair)?: None Help needed standing up from a chair using your arms (e.g., wheelchair or bedside chair)?: None Help needed to walk in hospital room?: None Help needed climbing 3-5 steps with a railing? : A Little 6 Click Score: 23    End of Session   Activity Tolerance: Patient tolerated treatment well Patient left: in chair;with call bell/phone within reach;with family/visitor present;with chair alarm set Nurse Communication: Mobility status PT Visit Diagnosis: Other abnormalities of gait and mobility (R26.89)    Time: 1539-1600 PT Time Calculation (min) (ACUTE ONLY): 21 min   Charges:   PT Evaluation $PT Eval Low Complexity: 1 Low          Mirelle Biskup, PT  Acute Rehab Dept [facility])  [phone]  [facility] Weekend Pager (Saturday/Sunday only)  [phone]  11/28/2021   [facility] 11/28/2021, 4:11 PM

## 2021-11-29 DIAGNOSIS — N179 Acute kidney failure, unspecified: Secondary | ICD-10-CM | POA: Diagnosis not present

## 2021-11-29 LAB — BASIC METABOLIC PANEL
Anion gap: 7 (ref 5–15)
BUN: 17 mg/dL (ref 8–23)
CO2: 21 mmol/L — ABNORMAL LOW (ref 22–32)
Calcium: 8.4 mg/dL — ABNORMAL LOW (ref 8.9–10.3)
Chloride: 114 mmol/L — ABNORMAL HIGH (ref 98–111)
Creatinine, Ser: 1.08 mg/dL — ABNORMAL HIGH (ref 0.44–1.00)
GFR, Estimated: 53 mL/min — ABNORMAL LOW (ref >60)
Glucose, Bld: 86 mg/dL (ref 70–99)
Potassium: 4.3 mmol/L (ref 3.5–5.1)
Sodium: 142 mmol/L (ref 135–145)

## 2021-11-29 NOTE — Discharge Summary (Signed)
PatientPhysician Discharge Summary  Rebecca Wolfe ZDG:[id number] DOB: Aug 11, 1945 DOA: 11/27/2021  PCP: Bennie Pierini, FNP  Admit date: 11/27/2021 Discharge date: 11/29/2021 30 Day Unplanned Readmission Risk Score    Flowsheet Row ED to Hosp-Admission (Current) from 11/27/2021 in [facility]  30 Day Unplanned Readmission Risk Score (%) 12.32 Filed at 11/29/2021 0801       This score is the patient's risk of an unplanned readmission within 30 days of being discharged (0 -100%). The score is based on dignosis, age, lab data, medications, orders, and past utilization.   Low:  0-14.9   Medium: 15-21.9   High: 22-29.9   Extreme: 30 and above          Admitted From: Home Disposition: Home  Recommendations for Outpatient Follow-up:  Follow up with PCP in 1-2 weeks Please obtain BMP/CBC in one week Please follow up with your PCP on the following pending results: Unresulted Labs (From admission, onward)    None         Home Health: None Equipment/Devices: None  Discharge Condition: Stable CODE STATUS: Full code Diet recommendation: Regular  Subjective: Seen and examined.  Daughter at the bedside.  Patient feels very well.  Although her blood pressure is slightly low but she has no dizziness or other complaints and she is requesting a discharge.  Brief/Interim Summary: Rebecca Wolfe is a 76 y.o. female with medical history significant of HTN, HLD, anxiety. Presented with generalized weakness and poor PO intake. Her symptoms started 4 weeks ago. She had some N/V. She had some intermittent RLQ/LLQ abdominal pain that seemed to be worsened with eating. She went to urgent care. She was given some fluids and anti-emetics. She initially felt better, but a few days later, her symptoms returned. This continued for a couple of weeks. Her family noticed that she was not eating well at all. She didn't have any fevers or sick contacts. Her family  brought her to her PCP. She was noted to be hypotensive and dehydrated. They recommended that she hold her BP meds. They ran some labs. Her lab work for notable for AKI. it was recommended that she come to the ED for evaluation.  She was admitted under hospitalist service with following issues.  Generalized weakness/poor p.o. intake/dehydration/AKI/hypotension: All of these things are interrelated.  She likely had some viral gastroenteritis leading to poor p.o. intake but yet she continued to take her antihypertensives and she never checks her blood pressure.  Which in turn likely related to hypotension causing ATN and dehydration as well as AKI.  She was started on IV fluids.  Patient is feeling better.  She has started to eat better.  She presented with creatinine 3.47, creatinine has also improved and is 1.08 today.  However her blood pressure has remained low normal despite of holding her hydrochlorothiazide and lisinopril.  Patient is not having any symptoms and she is requesting a discharge.  Based on the data, we are discontinuing her antihypertensives.  I have encouraged her to keep herself hydrated and check blood pressure twice daily.  She has been informed that she does not need antihypertensives unless her systolic blood pressure goes above 150 or diastolic goes above 90.  She and her daughter both verbalized understanding.  Follow-up with PCP.   Hyperlipidemia: Patient is not on any statin.  Previously she had hyperlipidemia but this time we checked a lipid panel and cholesterol and lipid panel appears to be within normal  range.  No need of statins.  Discharge plan was discussed with patient and/or family member and they verbalized understanding and agreed with it.  Discharge Diagnoses:  Principal Problem:   AKI (acute kidney injury) (HCC) Active Problems:   Hyperlipidemia   Hypotension   Nausea & vomiting   Abdominal pain    Discharge Instructions   Allergies as of 11/29/2021        Reactions   Shellfish Allergy Hives, Nausea Only, Swelling        Medication List     STOP taking these medications    atorvastatin 40 MG tablet Commonly known as: LIPITOR   lisinopril-hydrochlorothiazide 20-12.5 MG tablet Commonly known as: ZESTORETIC       TAKE these medications    acetaminophen 500 MG tablet Commonly known as: TYLENOL Take 1,000 mg by mouth daily as needed for headache (knee pain).   celecoxib 200 MG capsule Commonly known as: CELEBREX Take 1 capsule (200 mg total) by mouth 2 (two) times daily.   citalopram 20 MG tablet Commonly known as: CELEXA TAKE 1 TABLET BY MOUTH EVERY DAY   fluticasone 50 MCG/ACT nasal spray Commonly known as: FLONASE Place 2 sprays into both nostrils daily. What changed:  when to take this reasons to take this        Follow-up Information     Daphine Deutscher, Mary-Margaret, FNP Follow up in 1 week(s).   Specialty: Family Medicine Contact information: [address] [place] [address] [phone]                Allergies  Allergen Reactions   Shellfish Allergy Hives, Nausea Only and Swelling    Consultations: None   Procedures/Studies: US RENAL  Result Date: 11/27/2021 CLINICAL DATA:  Acute renal insufficiency EXAM: RENAL / URINARY TRACT ULTRASOUND COMPLETE COMPARISON:  None Available. FINDINGS: Right Kidney: Renal measurements: 9.7 x 4.0 x 4.6 cm = volume: 92.6 mL. Echogenicity within normal limits. No mass or hydronephrosis visualized. Left Kidney: Renal measurements: 8.4 x 4.0 x 4.1 cm = volume: 71 mL. Echogenicity within normal limits. No mass or hydronephrosis visualized. Bladder: Appears normal for degree of bladder distention. Other: None. IMPRESSION: No cause for acute renal insufficiency identified. Electronically Signed   By: Gerome Sam III M.D.   On: 11/27/2021 17:09   DG Abd 1 View  Result Date: 11/27/2021 CLINICAL DATA:  Decreased appetite and weakness EXAM: ABDOMEN - 1 VIEW  COMPARISON:  None Available. FINDINGS: Scattered air-filled small bowel loops but no distension. The soft tissue shadows are maintained. No worrisome calcifications. The bony structures are unremarkable. IMPRESSION: No plain film findings for an acute abdominal process. Electronically Signed   By: Rudie Meyer M.D.   On: 11/27/2021 14:17     Discharge Exam: Vitals:   11/28/21 1951 11/29/21 0513  BP: 129/74 (!) 98/55  Pulse: 92 75  Resp: 18 17  Temp: (!) 97.5 F (36.4 C) 98.3 F (36.8 C)  SpO2: 100% 100%   Vitals:   11/28/21 1342 11/28/21 1407 11/28/21 1951 11/29/21 0513  BP: (!) 89/53 (!) 93/59 129/74 (!) 98/55  Pulse: 80 85 92 75  Resp: 16  18 17   Temp: 97.6 F (36.4 C)  (!) 97.5 F (36.4 C) 98.3 F (36.8 C)  TempSrc: Oral   Oral  SpO2: 100%  100% 100%  Weight:      Height:        General: Pt is alert, awake, not in acute distress Cardiovascular: RRR, S1/S2 +, no  rubs, no gallops Respiratory: CTA bilaterally, no wheezing, no rhonchi Abdominal: Soft, NT, ND, bowel sounds + Extremities: no edema, no cyanosis    The results of significant diagnostics from this hospitalization (including imaging, microbiology, ancillary and laboratory) are listed below for reference.     Microbiology: Recent Results (from the past 240 hour(s))  Microscopic Examination     Status: Abnormal   Collection Time: 11/25/21  3:10 PM   Urine  Result Value Ref Range Status   WBC, UA 0-5 0 - 5 /hpf Final   RBC 0-2 0 - 2 /hpf Final   Epithelial Cells (non renal) 0-10 0 - 10 /hpf Final   Casts Present (A) None seen /lpf Final   Cast Type Hyaline casts N/A Final   Bacteria, UA None seen None seen/Few Final     Labs: BNP (last 3 results) No results for input(s): "BNP" in the last 8760 hours. Basic Metabolic Panel: Recent Labs  Lab 11/25/21 1340 11/27/21 1050 11/28/21 0555 11/29/21 0537  NA 134 134* 141 142  K 4.8 4.1 4.2 4.3  CL 96 102 115* 114*  CO2 15* 17* 20* 21*  GLUCOSE 88 108*  99 86  BUN 51* 60* 35* 17  CREATININE 3.47* 3.04* 1.61* 1.08*  CALCIUM 9.9 9.9 8.5* 8.4*   Liver Function Tests: Recent Labs  Lab 11/25/21 1340 11/27/21 1050 11/28/21 0555  AST 25 24 18   ALT 22 21 16   ALKPHOS 44 35* 25*  BILITOT 0.4 0.9 0.7  PROT 6.8 7.3 5.4*  ALBUMIN 4.8* 4.6 3.2*   Recent Labs  Lab 11/27/21 1050  LIPASE 57*   No results for input(s): "AMMONIA" in the last 168 hours. CBC: Recent Labs  Lab 11/25/21 1340 11/27/21 1050 11/28/21 0555  WBC 8.3 5.8 3.6*  NEUTROABS 4.5 3.7  --   HGB 12.3 12.3 9.8*  HCT 36.3 36.4 29.7*  MCV 92 93.3 96.1  PLT 245 179 126*   Cardiac Enzymes: No results for input(s): "CKTOTAL", "CKMB", "CKMBINDEX", "TROPONINI" in the last 168 hours. BNP: Invalid input(s): "POCBNP" CBG: No results for input(s): "GLUCAP" in the last 168 hours. D-Dimer No results for input(s): "DDIMER" in the last 72 hours. Hgb A1c No results for input(s): "HGBA1C" in the last 72 hours. Lipid Profile Recent Labs    11/28/21 1058  CHOL 120  HDL 49  LDLCALC 55  TRIG 79  CHOLHDL 2.4   Thyroid function studies No results for input(s): "TSH", "T4TOTAL", "T3FREE", "THYROIDAB" in the last 72 hours.  Invalid input(s): "FREET3" Anemia work up No results for input(s): "VITAMINB12", "FOLATE", "FERRITIN", "TIBC", "IRON", "RETICCTPCT" in the last 72 hours. Urinalysis    Component Value Date/Time   COLORURINE STRAW (A) 11/27/2021 1623   APPEARANCEUR CLEAR 11/27/2021 1623   APPEARANCEUR Clear 11/25/2021 1510   LABSPEC 1.008 11/27/2021 1623   PHURINE 5.0 11/27/2021 1623   GLUCOSEU NEGATIVE 11/27/2021 1623   HGBUR SMALL (A) 11/27/2021 1623   BILIRUBINUR NEGATIVE 11/27/2021 1623   BILIRUBINUR Negative 11/25/2021 1510   KETONESUR 5 (A) 11/27/2021 1623   PROTEINUR NEGATIVE 11/27/2021 1623   NITRITE NEGATIVE 11/27/2021 1623   LEUKOCYTESUR TRACE (A) 11/27/2021 1623   Sepsis Labs Recent Labs  Lab 11/25/21 1340 11/27/21 1050 11/28/21 0555  WBC 8.3 5.8  3.6*   Microbiology Recent Results (from the past 240 hour(s))  Microscopic Examination     Status: Abnormal   Collection Time: 11/25/21  3:10 PM   Urine  Result Value Ref Range Status   WBC,  UA 0-5 0 - 5 /hpf Final   RBC 0-2 0 - 2 /hpf Final   Epithelial Cells (non renal) 0-10 0 - 10 /hpf Final   Casts Present (A) None seen /lpf Final   Cast Type Hyaline casts N/A Final   Bacteria, UA None seen None seen/Few Final     Time coordinating discharge: Over 30 minutes  SIGNED:   Hughie Closs, MD  [facility] 11/29/2021, 9:29 AM *Please note that this is a verbal dictation therefore any spelling or grammatical errors are due to the "Dragon [employer] One" system interpretation. If 7PM-7AM, please contact night-coverage [email or web address]

## 2021-11-30 ENCOUNTER — Telehealth: Payer: Self-pay

## 2021-12-01 ENCOUNTER — Telehealth: Payer: Self-pay | Admitting: Nurse Practitioner

## 2021-12-01 NOTE — Telephone Encounter (Signed)
Transition Care Management Unsuccessful Follow-up Telephone Call  Date of discharge and from where:  [facility] 11/29/21 - AKI due to dehydration  Attempts:  3rd Attempt  Reason for unsuccessful TCM follow-up call:  Left voice message

## 2021-12-01 NOTE — Telephone Encounter (Signed)
Scheduled with Dr. Louanne Skye

## 2021-12-01 NOTE — Telephone Encounter (Signed)
Patient admitted to [facility] 6/24-6/26 - needs a hospital follow up. Please call back.

## 2021-12-02 NOTE — Telephone Encounter (Signed)
Unable to reach to schedule TCM

## 2021-12-08 ENCOUNTER — Ambulatory Visit (INDEPENDENT_AMBULATORY_CARE_PROVIDER_SITE_OTHER): Payer: Medicare Other | Admitting: Family Medicine

## 2021-12-08 ENCOUNTER — Encounter: Payer: Self-pay | Admitting: Family Medicine

## 2021-12-08 VITALS — BP 137/68 | HR 83 | Temp 98.1°F | Ht 64.0 in | Wt 115.0 lb

## 2021-12-08 DIAGNOSIS — R609 Edema, unspecified: Secondary | ICD-10-CM

## 2021-12-08 DIAGNOSIS — R112 Nausea with vomiting, unspecified: Secondary | ICD-10-CM

## 2021-12-08 DIAGNOSIS — N179 Acute kidney failure, unspecified: Secondary | ICD-10-CM | POA: Diagnosis not present

## 2021-12-08 LAB — CBC WITH DIFFERENTIAL/PLATELET
Basophils Absolute: 0 10*3/uL (ref 0.0–0.2)
Basos: 0 %
EOS (ABSOLUTE): 0 10*3/uL (ref 0.0–0.4)
Eos: 1 %
Hematocrit: 27.8 % — ABNORMAL LOW (ref 34.0–46.6)
Hemoglobin: 9.5 g/dL — ABNORMAL LOW (ref 11.1–15.9)
Immature Grans (Abs): 0 10*3/uL (ref 0.0–0.1)
Immature Granulocytes: 0 %
Lymphocytes Absolute: 1.4 10*3/uL (ref 0.7–3.1)
Lymphs: 35 %
MCH: 31.7 pg (ref 26.6–33.0)
MCHC: 34.2 g/dL (ref 31.5–35.7)
MCV: 93 fL (ref 79–97)
Monocytes Absolute: 0.4 10*3/uL (ref 0.1–0.9)
Monocytes: 10 %
Neutrophils Absolute: 2.2 10*3/uL (ref 1.4–7.0)
Neutrophils: 54 %
Platelets: 159 10*3/uL (ref 150–450)
RBC: 3 x10E6/uL — ABNORMAL LOW (ref 3.77–5.28)
RDW: 13.3 % (ref 11.7–15.4)
WBC: 4 10*3/uL (ref 3.4–10.8)

## 2021-12-08 LAB — CMP14+EGFR
ALT: 22 IU/L (ref 0–32)
AST: 23 IU/L (ref 0–40)
Albumin/Globulin Ratio: 2.3 — ABNORMAL HIGH (ref 1.2–2.2)
Albumin: 4.2 g/dL (ref 3.7–4.7)
Alkaline Phosphatase: 44 IU/L (ref 44–121)
BUN/Creatinine Ratio: 13 (ref 12–28)
BUN: 13 mg/dL (ref 8–27)
Bilirubin Total: 0.4 mg/dL (ref 0.0–1.2)
CO2: 19 mmol/L — ABNORMAL LOW (ref 20–29)
Calcium: 9.2 mg/dL (ref 8.7–10.3)
Chloride: 104 mmol/L (ref 96–106)
Creatinine, Ser: 1.02 mg/dL — ABNORMAL HIGH (ref 0.57–1.00)
Globulin, Total: 1.8 g/dL (ref 1.5–4.5)
Glucose: 66 mg/dL — ABNORMAL LOW (ref 70–99)
Potassium: 3.8 mmol/L (ref 3.5–5.2)
Sodium: 138 mmol/L (ref 134–144)
Total Protein: 6 g/dL (ref 6.0–8.5)
eGFR: 56 mL/min/{1.73_m2} — ABNORMAL LOW (ref >59)

## 2021-12-08 NOTE — Progress Notes (Signed)
BP 137/68   Pulse 83   Temp 98.1 F (36.7 C)   Ht '5\' 4"'  (1.626 m)   Wt 115 lb (52.2 kg)   SpO2 100%   BMI 19.74 kg/m    Subjective:   Patient ID: Rebecca Wolfe, female    DOB: [birth year], 76 y.o.   MRN: [id number]  HPI: Rebecca Wolfe is a 76 y.o. female presenting on 12/08/2021 for Hospitalization Follow-up (Dehydration, acute kidney injury) and Edema (BLE)   HPI Patient is coming in for hospital follow-up for dehydration acute kidney injury due to nausea and vomiting.  She was admitted on 11/27/2021 and discharged on 11/29/2021.  They think that she had a viral gastroenteritis that she was not recovering from and she got dehydrated.  Because of her low blood pressures they did stop her lisinopril hydrochlorothiazide and her biggest complaint now is that she is having less swelling in her lower extremities.  She says she is no longer having any nausea vomiting and is trying to keep plenty of fluids going.  She denies any lightheadedness or dizziness.  She says she is feeling a lot better since the hospital except for the leg swelling.  Relevant past medical, surgical, family and social history reviewed and updated as indicated. Interim medical history since our last visit reviewed. Allergies and medications reviewed and updated.  Review of Systems  Constitutional:  Negative for chills and fever.  HENT:  Negative for congestion.   Eyes:  Negative for visual disturbance.  Respiratory:  Negative for chest tightness and shortness of breath.   Cardiovascular:  Positive for leg swelling. Negative for chest pain.  Gastrointestinal:  Negative for abdominal pain, diarrhea, nausea and vomiting.  Musculoskeletal:  Negative for back pain and gait problem.  Skin:  Negative for rash.  Neurological:  Negative for light-headedness and headaches.  Psychiatric/Behavioral:  Negative for agitation and behavioral problems.   All other systems reviewed and are negative.   Per HPI unless specifically  indicated above   Allergies as of 12/08/2021       Reactions   Shellfish Allergy Hives, Nausea Only, Swelling        Medication List        Accurate as of December 08, 2021 10:29 AM. If you have any questions, ask your nurse or doctor.          acetaminophen 500 MG tablet Commonly known as: TYLENOL Take 1,000 mg by mouth daily as needed for headache (knee pain).   celecoxib 200 MG capsule Commonly known as: CELEBREX Take 1 capsule (200 mg total) by mouth 2 (two) times daily.   citalopram 20 MG tablet Commonly known as: CELEXA TAKE 1 TABLET BY MOUTH EVERY DAY   fluticasone 50 MCG/ACT nasal spray Commonly known as: FLONASE Place 2 sprays into both nostrils daily. What changed:  when to take this reasons to take this         Objective:   BP 137/68   Pulse 83   Temp 98.1 F (36.7 C)   Ht '5\' 4"'  (1.626 m)   Wt 115 lb (52.2 kg)   SpO2 100%   BMI 19.74 kg/m   Wt Readings from Last 3 Encounters:  12/08/21 115 lb (52.2 kg)  11/27/21 107 lb 12.9 oz (48.9 kg)  11/25/21 107 lb 12.8 oz (48.9 kg)    Physical Exam Vitals and nursing note reviewed.  Constitutional:      General: She is not in acute distress.  Appearance: She is well-developed. She is not diaphoretic.  Eyes:     Conjunctiva/sclera: Conjunctivae normal.  Cardiovascular:     Rate and Rhythm: Normal rate and regular rhythm.     Heart sounds: Normal heart sounds. No murmur heard. Pulmonary:     Effort: Pulmonary effort is normal. No respiratory distress.     Breath sounds: Normal breath sounds. No wheezing.  Musculoskeletal:        General: Swelling (2+ pitting edema in bilateral lower extremity) present. No tenderness. Normal range of motion.  Skin:    General: Skin is warm and dry.     Findings: No rash.  Neurological:     Mental Status: She is alert and oriented to person, place, and time.     Coordination: Coordination normal.  Psychiatric:        Behavior: Behavior normal.        Assessment & Plan:   Problem List Items Addressed This Visit       Digestive   Nausea & vomiting   Relevant Orders   CBC with Differential/Platelet   CMP14+EGFR     Genitourinary   AKI (acute kidney injury) ([place]) - Primary   Relevant Orders   CBC with Differential/Platelet   CMP14+EGFR   Other Visit Diagnoses     Peripheral edema       Relevant Orders   Compression stockings   CBC with Differential/Platelet   CMP14+EGFR       We will give compression stockings and recheck blood work.  Keep an eye on blood pressures at home but right now I would not restart the hydrochlorothiazide because normal blood pressures Follow up plan: Return if symptoms worsen or fail to improve.  Counseling provided for all of the vaccine components Orders Placed This Encounter  Procedures   Compression stockings   CBC with Differential/Platelet   CMP14+EGFR    Caryl Pina, MD [facility] 12/08/2021, 10:29 AM

## 2021-12-15 ENCOUNTER — Telehealth: Payer: Self-pay | Admitting: Nurse Practitioner

## 2021-12-16 NOTE — Telephone Encounter (Signed)
We did fax in an order for compression stockings, I do not recall where she had [place] fax it into but we did fax an order for compression stockings, we can double check on that.

## 2021-12-16 NOTE — Telephone Encounter (Signed)
Pt aware about the compression stockings. She says that they do not help. She has already tried it. Pt wants lisinopril for the swelling. Pt says that [facility]MM prescribed this to her before. Please call abck

## 2021-12-23 ENCOUNTER — Ambulatory Visit: Payer: Medicare Other | Admitting: Nurse Practitioner

## 2021-12-28 ENCOUNTER — Ambulatory Visit (INDEPENDENT_AMBULATORY_CARE_PROVIDER_SITE_OTHER): Payer: Medicare Other | Admitting: Nurse Practitioner

## 2021-12-28 ENCOUNTER — Encounter: Payer: Self-pay | Admitting: Nurse Practitioner

## 2021-12-28 VITALS — BP 82/47 | HR 110 | Temp 97.6°F | Resp 20 | Ht 64.0 in | Wt 110.0 lb

## 2021-12-28 DIAGNOSIS — R634 Abnormal weight loss: Secondary | ICD-10-CM | POA: Diagnosis not present

## 2021-12-28 DIAGNOSIS — D649 Anemia, unspecified: Secondary | ICD-10-CM | POA: Diagnosis not present

## 2021-12-28 LAB — CBC WITH DIFFERENTIAL/PLATELET
Basophils Absolute: 0 10*3/uL (ref 0.0–0.2)
Basos: 1 %
EOS (ABSOLUTE): 0 10*3/uL (ref 0.0–0.4)
Eos: 1 %
Hematocrit: 31.1 % — ABNORMAL LOW (ref 34.0–46.6)
Hemoglobin: 10.2 g/dL — ABNORMAL LOW (ref 11.1–15.9)
Immature Grans (Abs): 0 10*3/uL (ref 0.0–0.1)
Immature Granulocytes: 0 %
Lymphocytes Absolute: 1.9 10*3/uL (ref 0.7–3.1)
Lymphs: 38 %
MCH: 31 pg (ref 26.6–33.0)
MCHC: 32.8 g/dL (ref 31.5–35.7)
MCV: 95 fL (ref 79–97)
Monocytes Absolute: 0.4 10*3/uL (ref 0.1–0.9)
Monocytes: 7 %
Neutrophils Absolute: 2.7 10*3/uL (ref 1.4–7.0)
Neutrophils: 53 %
Platelets: 268 10*3/uL (ref 150–450)
RBC: 3.29 x10E6/uL — ABNORMAL LOW (ref 3.77–5.28)
RDW: 13.4 % (ref 11.7–15.4)
WBC: 5.1 10*3/uL (ref 3.4–10.8)

## 2021-12-28 MED ORDER — HEMOCYTE PLUS 106-1 MG PO CAPS: 1.0000 | ORAL_CAPSULE | Freq: Every day | ORAL | 1 refills | Status: DC

## 2021-12-28 NOTE — Progress Notes (Signed)
Subjective:    Patient ID: Rebecca Wolfe, female    DOB: 10-25-1945, 75 y.o.   MRN: [id number]   Chief Complaint: Recheck labs   HPI Patient was seen by Dr. Louanne Skye on 12/08/21 for hospital follow up. She was in hospital with AKI. She had a stomach virus that caused nausea and vomiting and she became dehydrated. She needs labs repeated today. Lab Results  Component Value Date   WBC 4.0 12/08/2021   HGB 9.5 (L) 12/08/2021   HCT 27.8 (L) 12/08/2021   MCV 93 12/08/2021   PLT 159 12/08/2021   Lab Results  Component Value Date   CREATININE 1.02 (H) 12/08/2021   She is feeling better.  Wt Readings from Last 3 Encounters:  12/28/21 110 lb (49.9 kg)  12/08/21 115 lb (52.2 kg)  11/27/21 107 lb 12.9 oz (48.9 kg)   BMI Readings from Last 3 Encounters:  12/28/21 18.88 kg/m  12/08/21 19.74 kg/m  11/27/21 18.50 kg/m      Review of Systems  Constitutional:  Negative for diaphoresis.  Eyes:  Negative for pain.  Respiratory:  Negative for shortness of breath.   Cardiovascular:  Negative for chest pain, palpitations and leg swelling.  Gastrointestinal:  Negative for abdominal pain.  Endocrine: Negative for polydipsia.  Skin:  Negative for rash.  Neurological:  Negative for dizziness, weakness and headaches.  Hematological:  Does not bruise/bleed easily.  All other systems reviewed and are negative.      Objective:   Physical Exam Vitals and nursing note reviewed.  Constitutional:      General: She is not in acute distress.    Appearance: Normal appearance. She is well-developed.  Neck:     Vascular: No carotid bruit or JVD.  Cardiovascular:     Rate and Rhythm: Normal rate and regular rhythm.     Heart sounds: Normal heart sounds.  Pulmonary:     Effort: Pulmonary effort is normal. No respiratory distress.     Breath sounds: Normal breath sounds. No wheezing or rales.  Chest:     Chest wall: No tenderness.  Abdominal:     General: Bowel sounds are normal. There  is no distension or abdominal bruit.     Palpations: Abdomen is soft. There is no hepatomegaly, splenomegaly, mass or pulsatile mass.     Tenderness: There is no abdominal tenderness.  Musculoskeletal:        General: Normal range of motion.     Cervical back: Normal range of motion and neck supple.  Lymphadenopathy:     Cervical: No cervical adenopathy.  Skin:    General: Skin is warm and dry.  Neurological:     Mental Status: She is alert and oriented to person, place, and time.     Deep Tendon Reflexes: Reflexes are normal and symmetric.  Psychiatric:        Behavior: Behavior normal.        Thought Content: Thought content normal.        Judgment: Judgment normal.    BP (!) 82/47   Pulse (!) 110   Temp 97.6 F (36.4 C) (Temporal)   Resp 20   Ht 5\' 4"  (1.626 m)   Wt 110 lb (49.9 kg)   BMI 18.88 kg/m         Assessment & Plan:  in today with chief complaint of Recheck labs   1. Anemia, unspecified type Labs pending - Ambulatory referral to Gastroenterology - CBC with Differential/Platelet -  Fe Fum-FA-B Cmp-C-Zn-Mg-Mn-Cu (HEMOCYTE PLUS) 106-1 MG CAPS; Take 1 tablet by mouth daily.  Dispense: 90 capsule; Refill: 1  2. Weight loss Increase fluid intake 3 meals a day and 2 snacks a day Eat snacks before bedtime.    The above assessment and management plan was discussed with the patient. The patient verbalized understanding of and has agreed to the management plan. Patient is aware to call the clinic if symptoms persist or worsen. Patient is aware when to return to the clinic for a follow-up visit. Patient educated on when it is appropriate to go to the emergency department.   Mary-Margaret Daphine Deutscher, FNP

## 2021-12-28 NOTE — Patient Instructions (Signed)
Anemia  Anemia is a condition in which there is not enough red blood cells or hemoglobin in the blood. Hemoglobin is a substance in red blood cells that carries oxygen. When you do not have enough red blood cells or hemoglobin (are anemic), your body cannot get enough oxygen and your organs may not work properly. As a result, you may feel very tired or have other problems. What are the causes? Common causes of anemia include: Excessive bleeding. Anemia can be caused by excessive bleeding inside or outside the body, including bleeding from the intestines or from heavy menstrual periods in females. Poor nutrition. Long-lasting (chronic) kidney, thyroid, and liver disease. Bone marrow disorders, spleen problems, and blood disorders. Cancer and treatments for cancer. HIV (human immunodeficiency virus) and AIDS (acquired immunodeficiency syndrome). Infections, medicines, and autoimmune disorders that destroy red blood cells. What are the signs or symptoms? Symptoms of this condition include: Minor weakness. Dizziness. Headache, or difficulties concentrating and sleeping. Heartbeats that feel irregular or faster than normal (palpitations). Shortness of breath, especially with exercise. Pale skin, lips, and nails, or cold hands and feet. Indigestion and nausea. Symptoms may occur suddenly or develop slowly. If your anemia is mild, you may not have symptoms. How is this diagnosed? This condition is diagnosed based on blood tests, your medical history, and a physical exam. In some cases, a test may be needed in which cells are removed from the soft tissue inside of a bone and looked at under a microscope (bone marrow biopsy). Your health care provider may also check your stool (feces) for blood and may do additional testing to look for the cause of your bleeding. Other tests may include: Imaging tests, such as a CT scan or MRI. A procedure to see inside your esophagus and stomach (endoscopy). A  procedure to see inside your colon and rectum (colonoscopy). How is this treated? Treatment for this condition depends on the cause. If you continue to lose a lot of blood, you may need to be treated at a hospital. Treatment may include: Taking supplements of iron, vitamin B12, or folic acid. Taking a hormone medicine (erythropoietin) that can help to stimulate red blood cell growth. Having a blood transfusion. This may be needed if you lose a lot of blood. Making changes to your diet. Having surgery to remove your spleen. Follow these instructions at home: Take over-the-counter and prescription medicines only as told by your health care provider. Take supplements only as told by your health care provider. Follow any diet instructions that you were given by your health care provider. Keep all follow-up visits as told by your health care provider. This is important. Contact a health care provider if: You develop new bleeding anywhere in the body. Get help right away if: You are very weak. You are short of breath. You have pain in your abdomen or chest. You are dizzy or feel faint. You have trouble concentrating. You have bloody stools, black stools, or tarry stools. You vomit repeatedly or you vomit up blood. These symptoms may represent a serious problem that is an emergency. Do not wait to see if the symptoms will go away. Get medical help right away. Call your local emergency services (911 in the [place] Do not drive yourself to the hospital. Summary Anemia is a condition in which you do not have enough red blood cells or enough of a substance in your red blood cells that carries oxygen (hemoglobin). Symptoms may occur suddenly or develop slowly. If your anemia   is mild, you may not have symptoms. This condition is diagnosed with blood tests, a medical history, and a physical exam. Other tests may be needed. Treatment for this condition depends on the cause of the anemia. This  information is not intended to replace advice given to you by your health care provider. Make sure you discuss any questions you have with your health care provider. Document Revised: 04/06/2021 Document Reviewed: 04/30/2019 Elsevier Patient Education  2023 [employer]  

## 2021-12-29 NOTE — Telephone Encounter (Signed)
Patient seen in office yesterday.

## 2021-12-30 ENCOUNTER — Encounter: Payer: Self-pay | Admitting: *Deleted

## 2022-01-26 ENCOUNTER — Ambulatory Visit: Payer: Medicare Other | Admitting: Internal Medicine

## 2022-01-28 ENCOUNTER — Ambulatory Visit: Payer: Medicare Other | Admitting: Nurse Practitioner

## 2022-02-18 ENCOUNTER — Other Ambulatory Visit: Payer: Self-pay | Admitting: Nurse Practitioner

## 2022-02-18 DIAGNOSIS — I10 Essential (primary) hypertension: Secondary | ICD-10-CM

## 2022-02-18 NOTE — Telephone Encounter (Signed)
This was discontinued in hospital 11/2021 due to dehydration and AKI-should pt restart or stay off?

## 2022-04-04 ENCOUNTER — Ambulatory Visit (INDEPENDENT_AMBULATORY_CARE_PROVIDER_SITE_OTHER): Payer: Medicare Other | Admitting: Nurse Practitioner

## 2022-04-04 ENCOUNTER — Encounter: Payer: Self-pay | Admitting: Nurse Practitioner

## 2022-04-04 VITALS — BP 128/59 | HR 63 | Temp 97.5°F | Resp 20 | Ht 64.0 in | Wt 116.0 lb

## 2022-04-04 DIAGNOSIS — I10 Essential (primary) hypertension: Secondary | ICD-10-CM

## 2022-04-04 DIAGNOSIS — F419 Anxiety disorder, unspecified: Secondary | ICD-10-CM | POA: Diagnosis not present

## 2022-04-04 DIAGNOSIS — E782 Mixed hyperlipidemia: Secondary | ICD-10-CM

## 2022-04-04 DIAGNOSIS — Z23 Encounter for immunization: Secondary | ICD-10-CM

## 2022-04-04 MED ORDER — LISINOPRIL-HYDROCHLOROTHIAZIDE 20-12.5 MG PO TABS: 2.0000 | ORAL_TABLET | Freq: Every day | ORAL | 1 refills | Status: DC

## 2022-04-04 NOTE — Patient Instructions (Addendum)

## 2022-04-04 NOTE — Progress Notes (Signed)
Subjective:    Patient ID: Rebecca Wolfe, female    DOB: 09-25-1945, 76 y.o.   MRN: [id number]   Chief Complaint: medical management of chronic issues     HPI:  Rebecca Wolfe is a 76 y.o. who identifies as a female who was assigned female at birth.   Social history: Lives with: by herself- her daughter checks on her daily Work history: retired   Scientist, forensic in today for follow up of the following chronic medical issues:  1. Primary hypertension No c/o chest pain, sob or headache. Doe snot check on blood pressure at home..she had low blood pressure at last check, when she came in with anemia., BP Readings from Last 3 Encounters:  12/28/21 (!) 82/47  12/08/21 137/68  11/29/21 (!) 98/55     2. Mixed hyperlipidemia Doe snot watch diet , but has a very poor appetite. Lab Results  Component Value Date   CHOL 120 11/28/2021   HDL 49 11/28/2021   LDLCALC 55 11/28/2021   TRIG 79 11/28/2021   CHOLHDL 2.4 11/28/2021     3. Anxiety Is on celexa daily. No medication side effects.    04/04/2022    9:52 AM 12/28/2021    9:29 AM 12/08/2021    9:11 AM 11/25/2021    1:21 PM  GAD 7 : Generalized Anxiety Score  Nervous, Anxious, on Edge 3 0 0 0  Control/stop worrying 2 0 0 1  Worry too much - different things 1 0 0 1  Trouble relaxing 0 0 0 0  Restless 0 0 0 0  Easily annoyed or irritable 0 0 0 0  Afraid - awful might happen 0 0 0 0  Total GAD 7 Score 6 0 0 2  Anxiety Difficulty Not difficult at all Not difficult at all Not difficult at all Not difficult at all       New complaints: Patient was seen on 12/28/21 with weight loss and her family was concerned. She was encouraged to eat 3 meals a day with snacks in between. We did referral to GI due to low hgb. There is no note in chart to indicate that she saw GI. She was scheduled to see them on 01/26/22, but according to chart appointmnet was cancelled. Her weigth is up 6 lbs.  Wt Readings from Last 3 Encounters:  04/04/22 116  lb (52.6 kg)  12/28/21 110 lb (49.9 kg)  12/08/21 115 lb (52.2 kg)   BMI Readings from Last 3 Encounters:  04/04/22 19.91 kg/m  12/28/21 18.88 kg/m  12/08/21 19.74 kg/m     Allergies  Allergen Reactions   Shellfish Allergy Hives, Nausea Only and Swelling   Outpatient Encounter Medications as of 04/04/2022  Medication Sig   acetaminophen (TYLENOL) 500 MG tablet Take 1,000 mg by mouth daily as needed for headache (knee pain).   celecoxib (CELEBREX) 200 MG capsule Take 1 capsule (200 mg total) by mouth 2 (two) times daily.   citalopram (CELEXA) 20 MG tablet TAKE 1 TABLET BY MOUTH EVERY DAY   Fe Fum-FA-B Cmp-C-Zn-Mg-Mn-Cu (HEMOCYTE PLUS) 106-1 MG CAPS Take 1 tablet by mouth daily.   fluticasone (FLONASE) 50 MCG/ACT nasal spray Place 2 sprays into both nostrils daily. (Patient taking differently: Place 2 sprays into both nostrils 2 (two) times daily as needed for allergies.)   lisinopril-hydrochlorothiazide (ZESTORETIC) 20-12.5 MG tablet TAKE 2 TABLETS BY MOUTH DAILY   No facility-administered encounter medications on file as of 04/04/2022.    No past surgical history  on file.  Family History  Problem Relation Age of Onset   Stroke Father    Hypertension Father    Hyperlipidemia Sister    Hypertension Sister    Cancer Sister 30       ovarian   Cancer Sister        Breast      Controlled substance contract: n/a     Review of Systems  Constitutional:  Negative for diaphoresis.  Eyes:  Negative for pain.  Respiratory:  Negative for shortness of breath.   Cardiovascular:  Negative for chest pain, palpitations and leg swelling.  Gastrointestinal:  Negative for abdominal pain.  Endocrine: Negative for polydipsia.  Skin:  Negative for rash.  Neurological:  Negative for dizziness, weakness and headaches.  Hematological:  Does not bruise/bleed easily.  All other systems reviewed and are negative.      Objective:   Physical Exam Vitals and nursing note reviewed.   Constitutional:      General: She is not in acute distress.    Appearance: Normal appearance. She is well-developed.  HENT:     Head: Normocephalic.     Right Ear: Tympanic membrane normal.     Left Ear: Tympanic membrane normal.     Nose: Nose normal.     Mouth/Throat:     Mouth: Mucous membranes are moist.  Eyes:     Pupils: Pupils are equal, round, and reactive to light.  Neck:     Vascular: No carotid bruit or JVD.  Cardiovascular:     Rate and Rhythm: Normal rate and regular rhythm.     Heart sounds: Normal heart sounds.  Pulmonary:     Effort: Pulmonary effort is normal. No respiratory distress.     Breath sounds: Normal breath sounds. No wheezing or rales.  Chest:     Chest wall: No tenderness.  Abdominal:     General: Bowel sounds are normal. There is no distension or abdominal bruit.     Palpations: Abdomen is soft. There is no hepatomegaly, splenomegaly, mass or pulsatile mass.     Tenderness: There is no abdominal tenderness.  Musculoskeletal:        General: Normal range of motion.     Cervical back: Normal range of motion and neck supple.  Lymphadenopathy:     Cervical: No cervical adenopathy.  Skin:    General: Skin is warm and dry.  Neurological:     Mental Status: She is alert and oriented to person, place, and time.     Deep Tendon Reflexes: Reflexes are normal and symmetric.  Psychiatric:        Behavior: Behavior normal.        Thought Content: Thought content normal.        Judgment: Judgment normal.     BP (!) 128/59   Pulse 63   Temp (!) 97.5 F (36.4 C) (Temporal)   Resp 20   Ht 5\' 4"  (1.626 m)   Wt 116 lb (52.6 kg)   SpO2 100%   BMI 19.91 kg/m        Assessment & Plan:   Rebecca Wolfe comes in today with chief complaint of Medical Management of Chronic Issues   Diagnosis and orders addressed:  1. Primary hypertension Low sodium diet - lisinopril-hydrochlorothiazide (ZESTORETIC) 20-12.5 MG tablet; Take 2 tablets by mouth  daily.  Dispense: 180 tablet; Refill: 1  2. Mixed hyperlipidemia Low fat diet  3. Anxiety Stress management   Labs pending Health Maintenance reviewed  Diet and exercise encouraged  Follow up plan: 6 months   Mary-Margaret Hassell Done, FNP

## 2022-04-05 LAB — LIPID PANEL
Chol/HDL Ratio: 2.7 ratio (ref 0.0–4.4)
Cholesterol, Total: 199 mg/dL (ref 100–199)
HDL: 73 mg/dL (ref >39)
LDL Chol Calc (NIH): 113 mg/dL — ABNORMAL HIGH (ref 0–99)
Triglycerides: 71 mg/dL (ref 0–149)
VLDL Cholesterol Cal: 13 mg/dL (ref 5–40)

## 2022-04-05 LAB — CMP14+EGFR
ALT: 18 IU/L (ref 0–32)
AST: 22 IU/L (ref 0–40)
Albumin/Globulin Ratio: 2.4 — ABNORMAL HIGH (ref 1.2–2.2)
Albumin: 4.8 g/dL (ref 3.8–4.8)
Alkaline Phosphatase: 64 IU/L (ref 44–121)
BUN/Creatinine Ratio: 24 (ref 12–28)
BUN: 29 mg/dL — ABNORMAL HIGH (ref 8–27)
Bilirubin Total: 0.2 mg/dL (ref 0.0–1.2)
CO2: 23 mmol/L (ref 20–29)
Calcium: 9.5 mg/dL (ref 8.7–10.3)
Chloride: 99 mmol/L (ref 96–106)
Creatinine, Ser: 1.22 mg/dL — ABNORMAL HIGH (ref 0.57–1.00)
Globulin, Total: 2 g/dL (ref 1.5–4.5)
Glucose: 93 mg/dL (ref 70–99)
Potassium: 5 mmol/L (ref 3.5–5.2)
Sodium: 139 mmol/L (ref 134–144)
Total Protein: 6.8 g/dL (ref 6.0–8.5)
eGFR: 45 mL/min/{1.73_m2} — ABNORMAL LOW (ref >59)

## 2022-04-05 LAB — CBC WITH DIFFERENTIAL/PLATELET
Basophils Absolute: 0 10*3/uL (ref 0.0–0.2)
Basos: 1 %
EOS (ABSOLUTE): 0 10*3/uL (ref 0.0–0.4)
Eos: 1 %
Hematocrit: 36 % (ref 34.0–46.6)
Hemoglobin: 12.1 g/dL (ref 11.1–15.9)
Immature Grans (Abs): 0 10*3/uL (ref 0.0–0.1)
Immature Granulocytes: 0 %
Lymphocytes Absolute: 1.8 10*3/uL (ref 0.7–3.1)
Lymphs: 37 %
MCH: 31.6 pg (ref 26.6–33.0)
MCHC: 33.6 g/dL (ref 31.5–35.7)
MCV: 94 fL (ref 79–97)
Monocytes Absolute: 0.3 10*3/uL (ref 0.1–0.9)
Monocytes: 7 %
Neutrophils Absolute: 2.7 10*3/uL (ref 1.4–7.0)
Neutrophils: 54 %
Platelets: 207 10*3/uL (ref 150–450)
RBC: 3.83 x10E6/uL (ref 3.77–5.28)
RDW: 11.5 % — ABNORMAL LOW (ref 11.7–15.4)
WBC: 4.9 10*3/uL (ref 3.4–10.8)

## 2022-05-18 ENCOUNTER — Telehealth: Payer: Self-pay | Admitting: Nurse Practitioner

## 2022-05-18 NOTE — Telephone Encounter (Signed)
Left message for patient to call back and schedule Medicare Annual Wellness Visit (AWV) to be completed by video or phone.   Last AWV: 03/05/2021   Please schedule at anytime with [facility] Nurse Health Advisor     45 minute appointment  Any questions, please contact me at [phone]

## 2022-07-04 ENCOUNTER — Ambulatory Visit (INDEPENDENT_AMBULATORY_CARE_PROVIDER_SITE_OTHER): Payer: 59

## 2022-07-04 NOTE — Progress Notes (Signed)
Patient states she is scheduled with [facility] home visit with Katharine Look 07/13/2022

## 2022-08-11 ENCOUNTER — Telehealth: Payer: Self-pay | Admitting: Nurse Practitioner

## 2022-08-11 NOTE — Telephone Encounter (Signed)
Spoke with patient and advised that we did not contact her and she advised that she feels like it was a scammer and that she was ok. I advised that if she needed anything else to give [place] a call and patient verbalized understanding

## 2022-08-11 NOTE — Telephone Encounter (Signed)
Patient said she received a call from our office telling her that her kidneys "are in really bad shape" and she was not told this the last time that she was here. She said that the person said this and hung up so she is unsure of who it was. Please call back to clear this up.

## 2022-08-15 ENCOUNTER — Telehealth: Payer: Self-pay | Admitting: Nurse Practitioner

## 2022-08-15 NOTE — Telephone Encounter (Signed)
Talking to [facility]

## 2022-10-03 ENCOUNTER — Ambulatory Visit: Payer: 59 | Admitting: Nurse Practitioner

## 2023-02-07 ENCOUNTER — Encounter: Payer: Self-pay | Admitting: Nurse Practitioner

## 2023-02-07 ENCOUNTER — Ambulatory Visit: Payer: 59 | Admitting: Nurse Practitioner

## 2023-02-07 NOTE — Progress Notes (Deleted)
  Chief Complaint: medical management of chronic issues     HPI:  Rebecca Wolfe is a 77 y.o. who identifies as a female who was assigned female at birth.   Social history: Lives with: by herself. Her daughters check on her daily Work history: retired   Water engineer in today for follow up of the following chronic medical issues:  1. Primary hypertension No c/o chest pain, sob or headache. Does not check blood pressure at home. BP Readings from Last 3 Encounters:  04/04/22 (!) 128/59  12/28/21 (!) 82/47  12/08/21 137/68     2. Mixed hyperlipidemia Does watch diet. Really has a very poor appetite. Lab Results  Component Value Date   CHOL 199 04/04/2022   HDL 73 04/04/2022   LDLCALC 113 (H) 04/04/2022   TRIG 71 04/04/2022   CHOLHDL 2.7 04/04/2022     3. Anxiety Is on no meds and has been doing well. ***   New complaints: ***  Allergies  Allergen Reactions   Shellfish Allergy Hives, Nausea Only and Swelling   Outpatient Encounter Medications as of 02/07/2023  Medication Sig   acetaminophen (TYLENOL) 500 MG tablet Take 1,000 mg by mouth daily as needed for headache (knee pain).   fluticasone (FLONASE) 50 MCG/ACT nasal spray Place 2 sprays into both nostrils daily. (Patient taking differently: Place 2 sprays into both nostrils 2 (two) times daily as needed for allergies.)   lisinopril-hydrochlorothiazide (ZESTORETIC) 20-12.5 MG tablet Take 2 tablets by mouth daily.   No facility-administered encounter medications on file as of 02/07/2023.    No past surgical history on file.  Family History  Problem Relation Age of Onset   Stroke Father    Hypertension Father    Hyperlipidemia Sister    Hypertension Sister    Cancer Sister 78       ovarian   Cancer Sister        Breast      Controlled substance contract: ***

## 2023-03-03 ENCOUNTER — Other Ambulatory Visit: Payer: 59

## 2023-03-03 ENCOUNTER — Ambulatory Visit: Payer: 59 | Admitting: Nurse Practitioner

## 2023-04-04 ENCOUNTER — Encounter: Payer: Self-pay | Admitting: Nurse Practitioner

## 2023-04-04 ENCOUNTER — Ambulatory Visit (INDEPENDENT_AMBULATORY_CARE_PROVIDER_SITE_OTHER): Payer: 59 | Admitting: Nurse Practitioner

## 2023-04-04 ENCOUNTER — Other Ambulatory Visit: Payer: Self-pay | Admitting: Nurse Practitioner

## 2023-04-04 ENCOUNTER — Other Ambulatory Visit: Payer: 59

## 2023-04-04 VITALS — BP 109/64 | HR 58 | Temp 97.2°F | Resp 20 | Ht 64.0 in | Wt 120.0 lb

## 2023-04-04 DIAGNOSIS — Z23 Encounter for immunization: Secondary | ICD-10-CM

## 2023-04-04 DIAGNOSIS — E782 Mixed hyperlipidemia: Secondary | ICD-10-CM

## 2023-04-04 DIAGNOSIS — Z780 Asymptomatic menopausal state: Secondary | ICD-10-CM

## 2023-04-04 DIAGNOSIS — I10 Essential (primary) hypertension: Secondary | ICD-10-CM | POA: Diagnosis not present

## 2023-04-04 DIAGNOSIS — F419 Anxiety disorder, unspecified: Secondary | ICD-10-CM

## 2023-04-04 MED ORDER — CITALOPRAM HYDROBROMIDE 20 MG PO TABS: 20.0000 mg | ORAL_TABLET | Freq: Every day | ORAL | 5 refills | Status: DC

## 2023-04-04 MED ORDER — LISINOPRIL-HYDROCHLOROTHIAZIDE 20-12.5 MG PO TABS: 2.0000 | ORAL_TABLET | Freq: Every day | ORAL | 1 refills | Status: DC

## 2023-04-04 NOTE — Progress Notes (Signed)
Subjective:    Patient ID: Rebecca Wolfe, female    DOB: 1945/12/09, 77 y.o.   MRN: [id number]   Chief Complaint: medical management of chronic issues     HPI:  Rebecca Wolfe is a 77 y.o. who identifies as a female who was assigned female at birth.   Social history: Lives with: by herself- daughters check on her daily Work history: retired   Water engineer in today for follow up of the following chronic medical issues:  1. Primary hypertension No c/o chest pain, sob or headache. Does not check blood pressure at home. BP Readings from Last 3 Encounters:  04/04/22 (!) 128/59  12/28/21 (!) 82/47  12/08/21 137/68     2. Mixed hyperlipidemia Doe snot eat much. Walks daily for exercise. Lab Results  Component Value Date   CHOL 199 04/04/2022   HDL 73 04/04/2022   LDLCALC 113 (H) 04/04/2022   TRIG 71 04/04/2022   CHOLHDL 2.7 04/04/2022     3. Anxiety Has some anxiety but does not want to be on any meds    04/04/2023   12:20 PM 04/04/2022    9:52 AM 12/28/2021    9:29 AM 12/08/2021    9:11 AM  GAD 7 : Generalized Anxiety Score  Nervous, Anxious, on Edge 1 3 0 0  Control/stop worrying 1 2 0 0  Worry too much - different things 1 1 0 0  Trouble relaxing 1 0 0 0  Restless 1 0 0 0  Easily annoyed or irritable 0 0 0 0  Afraid - awful might happen 0 0 0 0  Total GAD 7 Score 5 6 0 0  Anxiety Difficulty Not difficult at all Not difficult at all Not difficult at all Not difficult at all       04/04/2023   12:20 PM 04/04/2022    9:52 AM 12/28/2021    9:29 AM 12/08/2021    9:11 AM  GAD 7 : Generalized Anxiety Score  Nervous, Anxious, on Edge 1 3 0 0  Control/stop worrying 1 2 0 0  Worry too much - different things 1 1 0 0  Trouble relaxing 1 0 0 0  Restless 1 0 0 0  Easily annoyed or irritable 0 0 0 0  Afraid - awful might happen 0 0 0 0  Total GAD 7 Score 5 6 0 0  Anxiety Difficulty Not difficult at all Not difficult at all Not difficult at all Not difficult at all        New complaints: None today  Allergies  Allergen Reactions   Shellfish Allergy Hives, Nausea Only and Swelling   Outpatient Encounter Medications as of 04/04/2023  Medication Sig   acetaminophen (TYLENOL) 500 MG tablet Take 1,000 mg by mouth daily as needed for headache (knee pain).   fluticasone (FLONASE) 50 MCG/ACT nasal spray Place 2 sprays into both nostrils daily. (Patient taking differently: Place 2 sprays into both nostrils 2 (two) times daily as needed for allergies.)   lisinopril-hydrochlorothiazide (ZESTORETIC) 20-12.5 MG tablet Take 2 tablets by mouth daily.   No facility-administered encounter medications on file as of 04/04/2023.    No past surgical history on file.  Family History  Problem Relation Age of Onset   Stroke Father    Hypertension Father    Hyperlipidemia Sister    Hypertension Sister    Cancer Sister 77       ovarian   Cancer Sister  Breast      Controlled substance contract: n/a     Review of Systems  Constitutional:  Negative for diaphoresis.  Eyes:  Negative for pain.  Respiratory:  Negative for shortness of breath.   Cardiovascular:  Negative for chest pain, palpitations and leg swelling.  Gastrointestinal:  Negative for abdominal pain.  Endocrine: Negative for polydipsia.  Skin:  Negative for rash.  Neurological:  Negative for dizziness, weakness and headaches.  Hematological:  Does not bruise/bleed easily.  All other systems reviewed and are negative.      Objective:   Physical Exam Vitals and nursing note reviewed.  Constitutional:      General: She is not in acute distress.    Appearance: Normal appearance. She is well-developed.  HENT:     Head: Normocephalic.     Right Ear: Tympanic membrane normal.     Left Ear: Tympanic membrane normal.     Nose: Nose normal.     Mouth/Throat:     Mouth: Mucous membranes are moist.  Eyes:     Pupils: Pupils are equal, round, and reactive to light.  Neck:      Vascular: No carotid bruit or JVD.  Cardiovascular:     Rate and Rhythm: Normal rate and regular rhythm.     Heart sounds: Normal heart sounds.  Pulmonary:     Effort: Pulmonary effort is normal. No respiratory distress.     Breath sounds: Normal breath sounds. No wheezing or rales.  Chest:     Chest wall: No tenderness.  Abdominal:     General: Bowel sounds are normal. There is no distension or abdominal bruit.     Palpations: Abdomen is soft. There is no hepatomegaly, splenomegaly, mass or pulsatile mass.     Tenderness: There is no abdominal tenderness.  Musculoskeletal:        General: Normal range of motion.     Cervical back: Normal range of motion and neck supple.  Lymphadenopathy:     Cervical: No cervical adenopathy.  Skin:    General: Skin is warm and dry.  Neurological:     Mental Status: She is alert and oriented to person, place, and time.     Deep Tendon Reflexes: Reflexes are normal and symmetric.  Psychiatric:        Behavior: Behavior normal.        Thought Content: Thought content normal.        Judgment: Judgment normal.     BP 109/64   Pulse (!) 58   Temp (!) 97.2 F (36.2 C) (Temporal)   Resp 20   Ht 5\' 4"  (1.626 m)   Wt 120 lb (54.4 kg)   SpO2 98%   BMI 20.60 kg/m        Assessment & Plan:   Rebecca Wolfe comes in today with chief complaint of Medical Management of Chronic Issues   Diagnosis and orders addressed:  1. Primary hypertension Low sodium diet - lisinopril-hydrochlorothiazide (ZESTORETIC) 20-12.5 MG tablet; Take 2 tablets by mouth daily.  Dispense: 180 tablet; Refill: 1 - CBC with Differential/Platelet - CMP14+EGFR  2. Mixed hyperlipidemia Low fat diet - Lipid panel  3. Anxiety Stress management Start on celexa 20mg  daily- side effects reviewed  Labs pending Health Maintenance reviewed Diet and exercise encouraged  Follow up plan: 6 months   Mary-Margaret Daphine Deutscher, FNP

## 2023-04-04 NOTE — Patient Instructions (Signed)

## 2023-04-05 LAB — CBC WITH DIFFERENTIAL/PLATELET
Basophils Absolute: 0 10*3/uL (ref 0.0–0.2)
Basos: 1 %
EOS (ABSOLUTE): 0.1 10*3/uL (ref 0.0–0.4)
Eos: 1 %
Hematocrit: 36.2 % (ref 34.0–46.6)
Hemoglobin: 11.6 g/dL (ref 11.1–15.9)
Immature Grans (Abs): 0 10*3/uL (ref 0.0–0.1)
Immature Granulocytes: 0 %
Lymphocytes Absolute: 1.7 10*3/uL (ref 0.7–3.1)
Lymphs: 34 %
MCH: 30.9 pg (ref 26.6–33.0)
MCHC: 32 g/dL (ref 31.5–35.7)
MCV: 97 fL (ref 79–97)
Monocytes Absolute: 0.4 10*3/uL (ref 0.1–0.9)
Monocytes: 7 %
Neutrophils Absolute: 2.9 10*3/uL (ref 1.4–7.0)
Neutrophils: 57 %
Platelets: 226 10*3/uL (ref 150–450)
RBC: 3.75 x10E6/uL — ABNORMAL LOW (ref 3.77–5.28)
RDW: 11.9 % (ref 11.7–15.4)
WBC: 5.1 10*3/uL (ref 3.4–10.8)

## 2023-04-05 LAB — CMP14+EGFR
ALT: 13 [IU]/L (ref 0–32)
AST: 21 [IU]/L (ref 0–40)
Albumin: 4.7 g/dL (ref 3.8–4.8)
Alkaline Phosphatase: 62 [IU]/L (ref 44–121)
BUN/Creatinine Ratio: 16 (ref 12–28)
BUN: 20 mg/dL (ref 8–27)
Bilirubin Total: 0.3 mg/dL (ref 0.0–1.2)
CO2: 24 mmol/L (ref 20–29)
Calcium: 9.8 mg/dL (ref 8.7–10.3)
Chloride: 100 mmol/L (ref 96–106)
Creatinine, Ser: 1.22 mg/dL — ABNORMAL HIGH (ref 0.57–1.00)
Globulin, Total: 2.3 g/dL (ref 1.5–4.5)
Glucose: 89 mg/dL (ref 70–99)
Potassium: 5.5 mmol/L — ABNORMAL HIGH (ref 3.5–5.2)
Sodium: 137 mmol/L (ref 134–144)
Total Protein: 7 g/dL (ref 6.0–8.5)
eGFR: 45 mL/min/{1.73_m2} — ABNORMAL LOW (ref >59)

## 2023-04-05 LAB — LIPID PANEL
Chol/HDL Ratio: 3.6 ratio (ref 0.0–4.4)
Cholesterol, Total: 204 mg/dL — ABNORMAL HIGH (ref 100–199)
HDL: 56 mg/dL (ref >39)
LDL Chol Calc (NIH): 129 mg/dL — ABNORMAL HIGH (ref 0–99)
Triglycerides: 105 mg/dL (ref 0–149)
VLDL Cholesterol Cal: 19 mg/dL (ref 5–40)

## 2023-04-06 ENCOUNTER — Other Ambulatory Visit: Payer: Self-pay

## 2023-04-06 DIAGNOSIS — E875 Hyperkalemia: Secondary | ICD-10-CM

## 2023-04-07 ENCOUNTER — Other Ambulatory Visit: Payer: 59

## 2023-04-07 DIAGNOSIS — E875 Hyperkalemia: Secondary | ICD-10-CM

## 2023-04-08 LAB — BMP8+EGFR
BUN/Creatinine Ratio: 14 (ref 12–28)
BUN: 16 mg/dL (ref 8–27)
CO2: 22 mmol/L (ref 20–29)
Calcium: 9.5 mg/dL (ref 8.7–10.3)
Chloride: 90 mmol/L — ABNORMAL LOW (ref 96–106)
Creatinine, Ser: 1.11 mg/dL — ABNORMAL HIGH (ref 0.57–1.00)
Glucose: 82 mg/dL (ref 70–99)
Potassium: 4.8 mmol/L (ref 3.5–5.2)
Sodium: 128 mmol/L — ABNORMAL LOW (ref 134–144)
eGFR: 51 mL/min/{1.73_m2} — ABNORMAL LOW (ref >59)

## 2023-04-26 ENCOUNTER — Other Ambulatory Visit: Payer: Self-pay | Admitting: Nurse Practitioner

## 2023-07-24 ENCOUNTER — Other Ambulatory Visit: Payer: Self-pay | Admitting: Nurse Practitioner

## 2023-08-02 ENCOUNTER — Encounter: Payer: Self-pay | Admitting: *Deleted

## 2023-09-21 ENCOUNTER — Ambulatory Visit: Payer: 59 | Admitting: Nurse Practitioner

## 2023-09-28 ENCOUNTER — Ambulatory Visit: Payer: 59 | Admitting: Nurse Practitioner

## 2023-10-04 ENCOUNTER — Other Ambulatory Visit: Payer: Self-pay | Admitting: Nurse Practitioner

## 2023-10-04 DIAGNOSIS — I10 Essential (primary) hypertension: Secondary | ICD-10-CM

## 2023-11-30 ENCOUNTER — Other Ambulatory Visit: Payer: Self-pay | Admitting: Nurse Practitioner

## 2023-11-30 ENCOUNTER — Encounter: Payer: Self-pay | Admitting: Nurse Practitioner

## 2023-11-30 DIAGNOSIS — I10 Essential (primary) hypertension: Secondary | ICD-10-CM

## 2023-11-30 NOTE — Telephone Encounter (Signed)
 LMTCB to make an appt w/MMM to get refills on her meds. Pt was last seen on 04-04-2023. Also, I sent pt a letter about this!

## 2023-11-30 NOTE — Telephone Encounter (Signed)
 MMM pt NTBS 30-d given 10/05/23

## 2023-12-02 ENCOUNTER — Other Ambulatory Visit: Payer: Self-pay | Admitting: Nurse Practitioner

## 2023-12-02 DIAGNOSIS — I10 Essential (primary) hypertension: Secondary | ICD-10-CM

## 2023-12-04 ENCOUNTER — Encounter: Payer: Self-pay | Admitting: Nurse Practitioner

## 2023-12-04 NOTE — Telephone Encounter (Signed)
 I called pt to make her an appt w/MMM & no answer. I sent her a letter about this!

## 2023-12-04 NOTE — Telephone Encounter (Signed)
 MMM pt NTBS 30-d given 10/05/23

## 2023-12-09 ENCOUNTER — Other Ambulatory Visit: Payer: Self-pay | Admitting: Nurse Practitioner

## 2023-12-11 ENCOUNTER — Encounter: Payer: Self-pay | Admitting: Nurse Practitioner

## 2023-12-11 NOTE — Telephone Encounter (Signed)
 Letter sent.

## 2023-12-11 NOTE — Telephone Encounter (Signed)
 30 day supply given needs appt with PCP for further refills.

## 2023-12-12 ENCOUNTER — Other Ambulatory Visit: Payer: Self-pay | Admitting: Nurse Practitioner

## 2023-12-12 DIAGNOSIS — I10 Essential (primary) hypertension: Secondary | ICD-10-CM

## 2023-12-12 NOTE — Telephone Encounter (Unsigned)
 Copied from CRM [id number]. Topic: Clinical - Medication Refill >> Dec 12, 2023  4:23 PM Vivian Z wrote: Medication: lisinopril -hydrochlorothiazide  (ZESTORETIC ) 20-12.5 MG tablet  Has the patient contacted their pharmacy? Yes (Agent: If no, request that the patient contact the pharmacy for the refill. If patient does not wish to contact the pharmacy document the reason why and proceed with request.) (Agent: If yes, when and what did the pharmacy advise?)  This is the patient's preferred pharmacy:  [id number] - [place], [place] - [address] [place] [address] Phone: [phone] Fax: [phone]  Is this the correct pharmacy for this prescription? Yes If no, delete pharmacy and type the correct one.   Has the prescription been filled recently? No  Is the patient out of the medication? Yes  Has the patient been seen for an appointment in the last year OR does the patient have an upcoming appointment? Yes  Can we respond through MyChart? No  Agent: Please be advised that Rx refills may take up to 3 business days. We ask that you follow-up with your pharmacy.

## 2023-12-21 ENCOUNTER — Ambulatory Visit: Admitting: Nurse Practitioner

## 2023-12-21 ENCOUNTER — Encounter: Payer: Self-pay | Admitting: Nurse Practitioner

## 2023-12-21 VITALS — BP 125/63 | HR 62 | Temp 98.1°F | Ht 64.0 in | Wt 122.0 lb

## 2023-12-21 DIAGNOSIS — N179 Acute kidney failure, unspecified: Secondary | ICD-10-CM | POA: Diagnosis not present

## 2023-12-21 DIAGNOSIS — I10 Essential (primary) hypertension: Secondary | ICD-10-CM

## 2023-12-21 DIAGNOSIS — E782 Mixed hyperlipidemia: Secondary | ICD-10-CM

## 2023-12-21 DIAGNOSIS — F419 Anxiety disorder, unspecified: Secondary | ICD-10-CM

## 2023-12-21 LAB — LIPID PANEL

## 2023-12-21 MED ORDER — CITALOPRAM HYDROBROMIDE 20 MG PO TABS: 20.0000 mg | ORAL_TABLET | Freq: Every day | ORAL | 1 refills | Status: DC

## 2023-12-21 MED ORDER — LISINOPRIL-HYDROCHLOROTHIAZIDE 20-12.5 MG PO TABS: 2.0000 | ORAL_TABLET | Freq: Every day | ORAL | 1 refills | Status: DC

## 2023-12-21 NOTE — Addendum Note (Signed)
 Addended by: GLADIS MUSTARD on: 12/21/2023 09:35 AM   Modules accepted: Orders

## 2023-12-21 NOTE — Patient Instructions (Signed)

## 2023-12-21 NOTE — Progress Notes (Signed)
 Subjective:    Patient ID: Rebecca Wolfe, female    DOB: Aug 14, 1945, 78 y.o.   MRN: [id number]   Chief Complaint: medical management of chronic issues     HPI:  Rebecca Wolfe is a 78 y.o. who identifies as a female who was assigned female at birth.   Social history: Lives with: by herself- daughters check on her daily Work history: retired   Water engineer in today for follow up of the following chronic medical issues:  1. Primary hypertension No c/o chest pain, sob or headache. Does not check blood pressure at home. BP Readings from Last 3 Encounters:  04/04/23 109/64  04/04/22 (!) 128/59  12/28/21 (!) 82/47     2. Mixed hyperlipidemia Does not eat much. Walks daily for exercise. Lab Results  Component Value Date   CHOL 204 (H) 04/04/2023   HDL 56 04/04/2023   LDLCALC 129 (H) 04/04/2023   TRIG 105 04/04/2023   CHOLHDL 3.6 04/04/2023     3. Anxiety Has some anxiety but does not want to be on any meds      12/21/2023    9:16 AM 04/04/2023   12:20 PM 04/04/2022    9:52 AM 12/28/2021    9:29 AM  GAD 7 : Generalized Anxiety Score  Nervous, Anxious, on Edge 0 1 3 0  Control/stop worrying 1 1 2  0  Worry too much - different things 1 1 1  0  Trouble relaxing 0 1 0 0  Restless 0 1 0 0  Easily annoyed or irritable 0 0 0 0  Afraid - awful might happen 0 0 0 0  Total GAD 7 Score 2 5 6  0  Anxiety Difficulty Somewhat difficult Not difficult at all Not difficult at all Not difficult at all       12/21/2023    9:16 AM 04/04/2023   12:19 PM 04/04/2022    9:52 AM  Depression screen PHQ 2/9  Decreased Interest 0 0 0  Down, Depressed, Hopeless 0 1 0  PHQ - 2 Score 0 1 0  Altered sleeping  1 0  Tired, decreased energy  1 0  Change in appetite  1 3  Feeling bad or failure about yourself   0 0  Trouble concentrating  1 0  Moving slowly or fidgety/restless  0 0  Suicidal thoughts  0 0  PHQ-9 Score  5 3  Difficult doing work/chores  Not difficult at all Not difficult at  all        New complaints: None today  Allergies  Allergen Reactions   Shellfish Allergy Hives, Nausea Only and Swelling   Outpatient Encounter Medications as of 12/21/2023  Medication Sig   acetaminophen  (TYLENOL ) 500 MG tablet Take 1,000 mg by mouth daily as needed for headache (knee pain). (Patient not taking: Reported on 04/04/2023)   citalopram  (CELEXA ) 20 MG tablet Take 1 tablet (20 mg total) by mouth daily. Needs to be seen for further refills   fluticasone  (FLONASE ) 50 MCG/ACT nasal spray Place 2 sprays into both nostrils daily. (Patient not taking: Reported on 04/04/2023)   lisinopril -hydrochlorothiazide  (ZESTORETIC ) 20-12.5 MG tablet Take 2 tablets by mouth daily.   No facility-administered encounter medications on file as of 12/21/2023.    No past surgical history on file.  Family History  Problem Relation Age of Onset   Stroke Father    Hypertension Father    Hyperlipidemia Sister    Hypertension Sister    Cancer Sister 37  ovarian   Cancer Sister        Breast      Controlled substance contract: n/a     Review of Systems  Constitutional:  Negative for diaphoresis.  Eyes:  Negative for pain.  Respiratory:  Negative for shortness of breath.   Cardiovascular:  Negative for chest pain, palpitations and leg swelling.  Gastrointestinal:  Negative for abdominal pain.  Endocrine: Negative for polydipsia.  Skin:  Negative for rash.  Neurological:  Negative for dizziness, weakness and headaches.  Hematological:  Does not bruise/bleed easily.  All other systems reviewed and are negative.      Objective:   Physical Exam Vitals and nursing note reviewed.  Constitutional:      General: She is not in acute distress.    Appearance: Normal appearance. She is well-developed.  HENT:     Head: Normocephalic.     Right Ear: Tympanic membrane normal.     Left Ear: Tympanic membrane normal.     Nose: Nose normal.     Mouth/Throat:     Mouth: Mucous  membranes are moist.  Eyes:     Pupils: Pupils are equal, round, and reactive to light.  Neck:     Vascular: No carotid bruit or JVD.  Cardiovascular:     Rate and Rhythm: Normal rate and regular rhythm.     Pulses:          Dorsalis pedis pulses are 2+ on the right side and 2+ on the left side.     Heart sounds: Normal heart sounds.  Pulmonary:     Effort: Pulmonary effort is normal. No respiratory distress.     Breath sounds: Normal breath sounds. No wheezing or rales.  Chest:     Chest wall: No tenderness.  Abdominal:     General: Bowel sounds are normal. There is no distension or abdominal bruit.     Palpations: Abdomen is soft. There is no hepatomegaly, splenomegaly, mass or pulsatile mass.     Tenderness: There is no abdominal tenderness.  Musculoskeletal:        General: Normal range of motion.     Cervical back: Normal range of motion and neck supple.  Lymphadenopathy:     Cervical: No cervical adenopathy.  Skin:    General: Skin is warm and dry.  Neurological:     Mental Status: She is alert and oriented to person, place, and time.     Deep Tendon Reflexes: Reflexes are normal and symmetric.  Psychiatric:        Behavior: Behavior normal.        Thought Content: Thought content normal.        Judgment: Judgment normal.     BP 125/63   Pulse 62   Temp 98.1 F (36.7 C) (Temporal)   Ht 5' 4 (1.626 m)   Wt 122 lb (55.3 kg)   SpO2 100%   BMI 20.94 kg/m         Assessment & Plan:   SADAE ARRAZOLA comes in today with chief complaint of No chief complaint on file.   Diagnosis and orders addressed:  1. Primary hypertension Low sodium diet - lisinopril -hydrochlorothiazide  (ZESTORETIC ) 20-12.5 MG tablet; Take 2 tablets by mouth daily.  Dispense: 180 tablet; Refill: 1 - CBC with Differential/Platelet - CMP14+EGFR  2. Mixed hyperlipidemia Low fat diet - Lipid panel  3. Anxiety Stress management Start on celexa  20mg  daily- side effects reviewed  Labs  pending Health Maintenance reviewed Diet and  exercise encouraged  Follow up plan: 6 months   Mary-Margaret Gladis, FNP

## 2023-12-22 ENCOUNTER — Ambulatory Visit: Payer: Self-pay | Admitting: Nurse Practitioner

## 2023-12-22 ENCOUNTER — Telehealth: Payer: Self-pay

## 2023-12-22 LAB — CBC WITH DIFFERENTIAL/PLATELET
Basophils Absolute: 0 x10E3/uL (ref 0.0–0.2)
Basos: 1 %
EOS (ABSOLUTE): 0.1 x10E3/uL (ref 0.0–0.4)
Eos: 2 %
Hematocrit: 36.1 % (ref 34.0–46.6)
Hemoglobin: 11.6 g/dL (ref 11.1–15.9)
Immature Grans (Abs): 0 x10E3/uL (ref 0.0–0.1)
Immature Granulocytes: 0 %
Lymphocytes Absolute: 1.5 x10E3/uL (ref 0.7–3.1)
Lymphs: 36 %
MCH: 30.9 pg (ref 26.6–33.0)
MCHC: 32.1 g/dL (ref 31.5–35.7)
MCV: 96 fL (ref 79–97)
Monocytes Absolute: 0.3 x10E3/uL (ref 0.1–0.9)
Monocytes: 6 %
Neutrophils Absolute: 2.4 x10E3/uL (ref 1.4–7.0)
Neutrophils: 55 %
Platelets: 212 x10E3/uL (ref 150–450)
RBC: 3.75 x10E6/uL — ABNORMAL LOW (ref 3.77–5.28)
RDW: 12.3 % (ref 11.7–15.4)
WBC: 4.3 x10E3/uL (ref 3.4–10.8)

## 2023-12-22 LAB — CMP14+EGFR
ALT: 16 IU/L (ref 0–32)
AST: 20 IU/L (ref 0–40)
Albumin: 4.7 g/dL (ref 3.8–4.8)
Alkaline Phosphatase: 59 IU/L (ref 44–121)
BUN/Creatinine Ratio: 14 (ref 12–28)
BUN: 20 mg/dL (ref 8–27)
Bilirubin Total: 0.3 mg/dL (ref 0.0–1.2)
CO2: 21 mmol/L (ref 20–29)
Calcium: 9.5 mg/dL (ref 8.7–10.3)
Chloride: 104 mmol/L (ref 96–106)
Creatinine, Ser: 1.4 mg/dL — ABNORMAL HIGH (ref 0.57–1.00)
Globulin, Total: 2.1 g/dL (ref 1.5–4.5)
Glucose: 82 mg/dL (ref 70–99)
Potassium: 5.7 mmol/L — ABNORMAL HIGH (ref 3.5–5.2)
Sodium: 140 mmol/L (ref 134–144)
Total Protein: 6.8 g/dL (ref 6.0–8.5)
eGFR: 38 mL/min/1.73 — ABNORMAL LOW (ref >59)

## 2023-12-22 LAB — LIPID PANEL
Chol/HDL Ratio: 3.2 ratio (ref 0.0–4.4)
Cholesterol, Total: 181 mg/dL (ref 100–199)
HDL: 56 mg/dL (ref >39)
LDL Chol Calc (NIH): 109 mg/dL — ABNORMAL HIGH (ref 0–99)
Triglycerides: 88 mg/dL (ref 0–149)
VLDL Cholesterol Cal: 16 mg/dL (ref 5–40)

## 2023-12-22 NOTE — Telephone Encounter (Signed)
 Result encounter updated

## 2023-12-22 NOTE — Telephone Encounter (Signed)
 Copied from CRM [id number]. Topic: Clinical - Lab/Test Results >> Dec 22, 2023  1:02 PM Tobias L wrote: Reason for CRM: Relayed results to patient. No further questions.

## 2024-01-04 ENCOUNTER — Other Ambulatory Visit: Payer: Self-pay | Admitting: Nurse Practitioner

## 2024-01-04 DIAGNOSIS — I10 Essential (primary) hypertension: Secondary | ICD-10-CM

## 2024-02-23 ENCOUNTER — Ambulatory Visit

## 2024-02-23 VITALS — BP 125/63 | HR 62 | Ht 64.0 in | Wt 122.0 lb

## 2024-02-23 DIAGNOSIS — Z0000 Encounter for general adult medical examination without abnormal findings: Secondary | ICD-10-CM | POA: Diagnosis not present

## 2024-02-23 NOTE — Progress Notes (Signed)
 Subjective:   Rebecca Wolfe is a 78 y.o. who presents for a Medicare Wellness preventive visit.  As a reminder, Annual Wellness Visits don't include a physical exam, and some assessments may be limited, especially if this visit is performed virtually. We may recommend an in-person follow-up visit with your provider if needed.  Visit Complete: Virtual I connected with  Rebecca Wolfe on 02/23/24 by a audio enabled telemedicine application and verified that I am speaking with the correct person using two identifiers.  Patient Location: Home  Provider Location: Home Office  I discussed the limitations of evaluation and management by telemedicine. The patient expressed understanding and agreed to proceed.  Vital Signs: Because this visit was a virtual/telehealth visit, some criteria may be missing or patient reported. Any vitals not documented were not able to be obtained and vitals that have been documented are patient reported.  VideoDeclined- This patient declined Librarian, academic. Therefore the visit was completed with audio only.  Persons Participating in Visit: Patient.  AWV Questionnaire: No: Patient Medicare AWV questionnaire was not completed prior to this visit.  Cardiac Risk Factors include: advanced age (>38men, >18 women);dyslipidemia;hypertension     Objective:    Today's Vitals   02/23/24 0904  BP: 125/63  Pulse: 62  Weight: 122 lb (55.3 kg)  Height: 5' 4 (1.626 m)   Body mass index is 20.94 kg/m.     02/23/2024    9:10 AM 11/27/2021   10:26 AM 03/05/2021    4:43 PM 04/03/2019    8:25 AM  Advanced Directives  Does Patient Have a Medical Advance Directive? No No Yes No  Type of Best boy of [place];Living will   Copy of Healthcare Power of Attorney in Chart?   No - copy requested   Would patient like information on creating a medical advance directive?  No - Patient declined  No - Patient declined     Current Medications (verified) Outpatient Encounter Medications as of 02/23/2024  Medication Sig   acetaminophen  (TYLENOL ) 500 MG tablet Take 1,000 mg by mouth daily as needed for headache (knee pain).   citalopram  (CELEXA ) 20 MG tablet Take 1 tablet (20 mg total) by mouth daily. Needs to be seen for further refills   lisinopril -hydrochlorothiazide  (ZESTORETIC ) 20-12.5 MG tablet Take 2 tablets by mouth daily.   fluticasone  (FLONASE ) 50 MCG/ACT nasal spray Place 2 sprays into both nostrils daily. (Patient not taking: Reported on 02/23/2024)   No facility-administered encounter medications on file as of 02/23/2024.    Allergies (verified) Shellfish allergy   History: Past Medical History:  Diagnosis Date   Arthritis    Bone spur of right foot    Hyperlipidemia    Hypertension    History reviewed. No pertinent surgical history. Family History  Problem Relation Age of Onset   Stroke Father    Hypertension Father    Hyperlipidemia Sister    Hypertension Sister    Cancer Sister 39       ovarian   Cancer Sister        Breast   Social History   Socioeconomic History   Marital status: Widowed    Spouse name: Not on file   Number of children: 3   Years of education: 10   Highest education level: Not on file  Occupational History   Occupation: Retired  Tobacco Use   Smoking status: Never   Smokeless tobacco: Never  Vaping Use   Vaping  status: Never Used  Substance and Sexual Activity   Alcohol use: No   Drug use: No   Sexual activity: Not Currently  Other Topics Concern   Not on file  Social History Narrative   She lives alone - youngest daughter, Levon has colon cancer and she is helping care for her at this time - 03/05/2021   Social Drivers of Health   Financial Resource Strain: Low Risk  (02/23/2024)   Overall Financial Resource Strain (CARDIA)    Difficulty of Paying Living Expenses: Not hard at all  Food Insecurity: No Food Insecurity (02/23/2024)   Hunger  Vital Sign    Worried About Running Out of Food in the Last Year: Never true    Ran Out of Food in the Last Year: Never true  Transportation Needs: No Transportation Needs (02/23/2024)   PRAPARE - Administrator, Civil Service (Medical): No    Lack of Transportation (Non-Medical): No  Physical Activity: Sufficiently Active (02/23/2024)   Exercise Vital Sign    Days of Exercise per Week: 7 days    Minutes of Exercise per Session: 60 min  Stress: No Stress Concern Present (02/23/2024)   [employer] of Occupational Health - Occupational Stress Questionnaire    Feeling of Stress: Only a little  Social Connections: Socially Isolated (02/23/2024)   Social Connection and Isolation Panel    Frequency of Communication with Friends and Family: More than three times a week    Frequency of Social Gatherings with Friends and Family: More than three times a week    Attends Religious Services: Never    Database administrator or Organizations: No    Attends Banker Meetings: Never    Marital Status: Widowed    Tobacco Counseling Counseling given: Yes    Clinical Intake:  Pre-visit preparation completed: Yes  Pain : No/denies pain     BMI - recorded: 20.94 Nutritional Status: BMI of 19-24  Normal Nutritional Risks: None Diabetes: No  Lab Results  Component Value Date   HGBA1C 6.6 (H) 08/17/2006     How often do you need to have someone help you when you read instructions, pamphlets, or other written materials from your doctor or pharmacy?: 1 - Never  Interpreter Needed?: No  Information entered by :: alia t/cma   Activities of Daily Living     02/23/2024    9:08 AM  In your present state of health, do you have any difficulty performing the following activities:  Hearing? 0  Vision? 0  Difficulty concentrating or making decisions? 0  Walking or climbing stairs? 0  Dressing or bathing? 0  Doing errands, shopping? 1  Comment pt's daughter   Quarry manager and eating ? N  Using the Toilet? N  In the past six months, have you accidently leaked urine? N  Do you have problems with loss of bowel control? N  Managing your Medications? N  Managing your Finances? Y  Comment pt's daughter  Housekeeping or managing your Housekeeping? N    Patient Care Team: Gladis Mustard, FNP as PCP - General (Family Medicine)  I have updated your Care Teams any recent Medical Services you may have received from other providers in the past year.     Assessment:   This is a routine wellness examination for Rebecca Wolfe.  Hearing/Vision screen Hearing Screening - Comments:: Pt denies hearing dif Vision Screening - Comments:: Pt denies vision dif/n/a   Goals Addressed  This Visit's Progress    AWV Goals   On track    04/03/2019 AWV Goal: Fall Prevention  Over the next year, patient will decrease their risk for falls by: Using assistive devices, such as a cane or walker, as needed Identifying fall risks within their home and correcting them by: Removing throw rugs Adding handrails to stairs or ramps Removing clutter and keeping a clear pathway throughout the home Increasing light, especially at night Adding shower handles/bars Raising toilet seat Identifying potential personal risk factors for falls: Medication side effects Incontinence/urgency Vestibular dysfunction Hearing loss Musculoskeletal disorders Neurological disorders Orthostatic hypotension         Depression Screen     02/23/2024    9:11 AM 12/21/2023    9:16 AM 04/04/2023   12:19 PM 04/04/2022    9:52 AM 12/28/2021    9:29 AM 12/08/2021    9:10 AM 11/25/2021    1:20 PM  PHQ 2/9 Scores  PHQ - 2 Score 0 0 1 0 0 0 1  PHQ- 9 Score   5 3 6 4 9     Fall Risk     02/23/2024    9:06 AM 12/21/2023    9:16 AM 04/04/2023   12:19 PM 04/04/2022    9:52 AM 12/28/2021    9:28 AM  Fall Risk   Falls in the past year? 0 0 0 0 0  Number falls in past yr:  0      Injury with Fall? 0      Risk for fall due to : No Fall Risks      Follow up Falls evaluation completed        MEDICARE RISK AT HOME:  Medicare Risk at Home Any stairs in or around the home?: No If so, are there any without handrails?: No Home free of loose throw rugs in walkways, pet beds, electrical cords, etc?: Yes Adequate lighting in your home to reduce risk of falls?: Yes Life alert?: No Use of a cane, walker or w/c?: No Grab bars in the bathroom?: No Shower chair or bench in shower?: No Elevated toilet seat or a handicapped toilet?: No  TIMED UP AND GO:  Was the test performed?  no  Cognitive Function: 6CIT completed    03/05/2021    4:49 PM 11/15/2017    4:11 PM 11/15/2017    2:54 PM  MMSE - Mini Mental State Exam  Not completed: Refused  Unable to complete  Orientation to time  5 5  Orientation to Place  5 5  Registration  3 3  Attention/ Calculation   0  Attention/Calculation-comments   refused  Recall  3 3  Language- name 2 objects  2 2  Language- repeat  1 1  Language- follow 3 step command  3 3  Language- read & follow direction  1 1  Write a sentence  1 1  Copy design  1 1  Total score   25    Per pt can't do test at this time, she is going through a lot now/with older sister passing    04/03/2019    8:27 AM  6CIT Screen  What Year? 0 points  What month? 0 points  What time? 0 points  Count back from 20 4 points  Months in reverse 4 points  Repeat phrase 2 points  Total Score 10 points    Immunizations Immunization History  Administered Date(s) Administered   Fluad Quad(high Dose 65+) 04/02/2019, 04/02/2021, 04/04/2022  Fluad Trivalent(High Dose 65+) 04/04/2023   INFLUENZA, HIGH DOSE SEASONAL PF 04/18/2017   Influenza,inj,Quad PF,6+ Mos 04/24/2013, 04/25/2013, 06/30/2014, 03/31/2015   Moderna Sars-Covid-2 Vaccination 06/19/2020   Pneumococcal Conjugate-13 10/01/2020   Pneumococcal Polysaccharide-23 04/21/2009   Td 04/21/2009     Screening Tests Health Maintenance  Topic Date Due   DEXA SCAN  Never done   Influenza Vaccine  01/05/2024   COVID-19 Vaccine (2 - 2025-26 season) 02/05/2024   Zoster Vaccines- Shingrix (1 of 2) 03/22/2024 (Originally 10/27/1993)   DTaP/Tdap/Td (2 - Tdap) 04/03/2024 (Originally 04/22/2019)   Medicare Annual Wellness (AWV)  02/22/2025   Pneumococcal Vaccine: 50+ Years  Completed   HPV VACCINES  Aged Out   Meningococcal B Vaccine  Aged Out   Hepatitis C Screening  Discontinued    Health Maintenance Items Addressed: See Nurse Notes at the end of this note  Additional Screening:  Vision Screening: Recommended annual ophthalmology exams for early detection of glaucoma and other disorders of the eye. Is the patient up to date with their annual eye exam?No Who is the provider or what is the name of the office in which the patient attends annual eye exams? N/a  Dental Screening: Recommended annual dental exams for proper oral hygiene  Community Resource Referral / Chronic Care Management: CRR required this visit?  No   CCM required this visit?  No   Plan:    I have personally reviewed and noted the following in the patient's chart:   Medical and social history Use of alcohol, tobacco or illicit drugs  Current medications and supplements including opioid prescriptions. Patient is not currently taking opioid prescriptions. Functional ability and status Nutritional status Physical activity Advanced directives List of other physicians Hospitalizations, surgeries, and ER visits in previous 12 months Vitals Screenings to include cognitive, depression, and falls Referrals and appointments  In addition, I have reviewed and discussed with patient certain preventive protocols, quality metrics, and best practice recommendations. A written personalized care plan for preventive services as well as general preventive health recommendations were provided to patient.   Ozie Ned,  CMA   02/23/2024   After Visit Summary: (Declined) Due to this being a telephonic visit, with patients personalized plan was offered to patient but patient Declined AVS at this time   Notes: Nothing significant to report at this time. Per Pt declined Dexa scan

## 2024-02-23 NOTE — Patient Instructions (Signed)
 Rebecca Wolfe,  Thank you for taking the time for your Medicare Wellness Visit. I appreciate your continued commitment to your health goals. Please review the care plan we discussed, and feel free to reach out if I can assist you further.  Medicare recommends these wellness visits once per year to help you and your care team stay ahead of potential health issues. These visits are designed to focus on prevention, allowing your provider to concentrate on managing your acute and chronic conditions during your regular appointments.  Please note that Annual Wellness Visits do not include a physical exam. Some assessments may be limited, especially if the visit was conducted virtually. If needed, we may recommend a separate in-person follow-up with your provider.  Ongoing Care Seeing your primary care provider every 3 to 6 months helps us  monitor your health and provide consistent, personalized care.   Referrals If a referral was made during today's visit and you haven't received any updates within two weeks, please contact the referred provider directly to check on the status.  Recommended Screenings:  Health Maintenance  Topic Date Due   DEXA scan (bone density measurement)  Never done   Medicare Annual Wellness Visit  03/05/2022   Flu Shot  01/05/2024   COVID-19 Vaccine (2 - 2025-26 season) 02/05/2024   Zoster (Shingles) Vaccine (1 of 2) 03/22/2024*   DTaP/Tdap/Td vaccine (2 - Tdap) 04/03/2024*   Pneumococcal Vaccine for age over 73  Completed   HPV Vaccine  Aged Out   Meningitis B Vaccine  Aged Out   Hepatitis C Screening  Discontinued  *Topic was postponed. The date shown is not the original due date.       02/23/2024    9:10 AM  Advanced Directives  Does Patient Have a Medical Advance Directive? No   Advance Care Planning is important because it: Ensures you receive medical care that aligns with your values, goals, and preferences. Provides guidance to your family and loved ones,  reducing the emotional burden of decision-making during critical moments.  Vision: Annual vision screenings are recommended for early detection of glaucoma, cataracts, and diabetic retinopathy. These exams can also reveal signs of chronic conditions such as diabetes and high blood pressure.  Dental: Annual dental screenings help detect early signs of oral cancer, gum disease, and other conditions linked to overall health, including heart disease and diabetes.  Please see the attached documents for additional preventive care recommendations.

## 2024-03-21 ENCOUNTER — Ambulatory Visit: Admitting: Nurse Practitioner

## 2024-03-21 ENCOUNTER — Encounter: Payer: Self-pay | Admitting: Nurse Practitioner

## 2024-03-29 ENCOUNTER — Ambulatory Visit: Admitting: Nurse Practitioner

## 2024-03-29 ENCOUNTER — Encounter: Payer: Self-pay | Admitting: Nurse Practitioner

## 2024-03-29 VITALS — BP 128/64 | HR 78 | Temp 97.2°F | Ht 64.0 in | Wt 119.0 lb

## 2024-03-29 DIAGNOSIS — J0100 Acute maxillary sinusitis, unspecified: Secondary | ICD-10-CM | POA: Diagnosis not present

## 2024-03-29 MED ORDER — DOXYCYCLINE HYCLATE 100 MG PO TABS: 100.0000 mg | ORAL_TABLET | Freq: Two times a day (BID) | ORAL | 0 refills | Status: AC

## 2024-03-29 NOTE — Patient Instructions (Signed)
 1. Take meds as prescribed 2. Use a cool mist humidifier especially during the winter months and when heat has been humid. 3. Use saline nose sprays frequently 4. Saline irrigations of the nose can be very helpful if done frequently.  * 4X daily for 1 week*  * Use of a nettie pot can be helpful with this. Follow directions with this* 5. Drink plenty of fluids 6. Keep thermostat turn down low 7.For any cough or congestion- Claritin d  8. For fever or aces or pains- take tylenol  or ibuprofen appropriate for age and weight.  * for fevers greater than 101 orally you may alternate ibuprofen and tylenol  every  3 hours.

## 2024-03-29 NOTE — Progress Notes (Signed)
 Subjective:    Patient ID: Rebecca Wolfe, female    DOB: Mar 22, 1946, 78 y.o.   MRN: [id number]   Chief Complaint: Sinus Problem   Sinus Problem This is a new problem. The current episode started in the past 7 days. The problem has been waxing and waning since onset. Associated symptoms include congestion, headaches and sinus pressure. Pertinent negatives include no chills. Past treatments include acetaminophen . The treatment provided mild relief.    Patient Active Problem List   Diagnosis Date Noted   AKI (acute kidney injury) 11/27/2021   Anxiety 11/27/2021   Hypertension 11/12/2012   Hyperlipidemia 11/12/2012       Review of Systems  Constitutional:  Negative for chills, fatigue and fever.  HENT:  Positive for congestion and sinus pressure.   Neurological:  Positive for headaches.       Objective:   Physical Exam Constitutional:      Appearance: Normal appearance.  HENT:     Right Ear: Tympanic membrane normal.     Left Ear: Tympanic membrane normal.     Nose: Congestion and rhinorrhea present.     Right Sinus: Maxillary sinus tenderness present.     Left Sinus: Maxillary sinus tenderness present.     Mouth/Throat:     Pharynx: No oropharyngeal exudate.  Cardiovascular:     Rate and Rhythm: Normal rate and regular rhythm.     Heart sounds: Normal heart sounds.  Pulmonary:     Breath sounds: Normal breath sounds.  Musculoskeletal:     Cervical back: Normal range of motion.  Skin:    General: Skin is warm.  Neurological:     General: No focal deficit present.     Mental Status: She is alert and oriented to person, place, and time.  Psychiatric:        Mood and Affect: Mood normal.        Behavior: Behavior normal.     BP 128/64   Pulse 78   Temp (!) 97.2 F (36.2 C) (Temporal)   Ht 5' 4 (1.626 m)   Wt 119 lb (54 kg)   SpO2 98%   BMI 20.43 kg/m        Assessment & Plan:   TAHEERA THOMANN in today with chief complaint of Sinus  Problem   1. Acute non-recurrent maxillary sinusitis (Primary) 1. Take meds as prescribed 2. Use a cool mist humidifier especially during the winter months and when heat has been humid. 3. Use saline nose sprays frequently 4. Saline irrigations of the nose can be very helpful if done frequently.  * 4X daily for 1 week*  * Use of a nettie pot can be helpful with this. Follow directions with this* 5. Drink plenty of fluids 6. Keep thermostat turn down low 7.For any cough or congestion- claritin D 8. For fever or aces or pains- take tylenol  or ibuprofen appropriate for age and weight.  * for fevers greater than 101 orally you may alternate ibuprofen and tylenol  every  3 hours.    Meds ordered this encounter  Medications   doxycycline (VIBRA-TABS) 100 MG tablet    Sig: Take 1 tablet (100 mg total) by mouth 2 (two) times daily. 1 po bid    Dispense:  20 tablet    Refill:  0    Supervising Provider:   MARYANNE CHEW A [[id number]]      The above assessment and management plan was discussed with the patient. The patient verbalized understanding of  and has agreed to the management plan. Patient is aware to call the clinic if symptoms persist or worsen. Patient is aware when to return to the clinic for a follow-up visit. Patient educated on when it is appropriate to go to the emergency department.   Mary-Margaret Gladis, FNP

## 2024-05-03 ENCOUNTER — Other Ambulatory Visit: Payer: Self-pay | Admitting: Nurse Practitioner

## 2024-05-03 DIAGNOSIS — I10 Essential (primary) hypertension: Secondary | ICD-10-CM

## 2024-06-25 ENCOUNTER — Other Ambulatory Visit

## 2024-06-25 ENCOUNTER — Ambulatory Visit: Payer: Self-pay | Admitting: Nurse Practitioner

## 2024-06-25 ENCOUNTER — Other Ambulatory Visit: Payer: Self-pay | Admitting: Nurse Practitioner

## 2024-06-25 ENCOUNTER — Encounter: Payer: Self-pay | Admitting: Nurse Practitioner

## 2024-06-25 DIAGNOSIS — Z780 Asymptomatic menopausal state: Secondary | ICD-10-CM

## 2024-06-25 DIAGNOSIS — Z13820 Encounter for screening for osteoporosis: Secondary | ICD-10-CM

## 2024-06-25 NOTE — Progress Notes (Unsigned)
 "  Subjective:    Patient ID: Rebecca Wolfe, female    DOB: Aug 17, 1945, 79 y.o.   MRN: [id number]   Chief Complaint: medical management of chronic issues     HPI:  Rebecca Wolfe is a 79 y.o. who identifies as a female who was assigned female at birth.   Social history: Lives with: by herself- daughters check on her daily Work history: retired   Water Engineer in today for follow up of the following chronic medical issues:  1. Primary hypertension No c/o chest pain, sob or headache. Does not check blood pressure at home. BP Readings from Last 3 Encounters:  03/29/24 128/64  02/23/24 125/63  12/21/23 125/63     2. Mixed hyperlipidemia Does not eat much. Walks daily for exercise. Lab Results  Component Value Date   CHOL 181 12/21/2023   HDL 56 12/21/2023   LDLCALC 109 (H) 12/21/2023   TRIG 88 12/21/2023   CHOLHDL 3.2 12/21/2023     3. Anxiety Has some anxiety but does not want to be on any meds      12/21/2023    9:16 AM 04/04/2023   12:20 PM 04/04/2022    9:52 AM 12/28/2021    9:29 AM  GAD 7 : Generalized Anxiety Score  Nervous, Anxious, on Edge 0  1  3  0   Control/stop worrying 1  1  2   0   Worry too much - different things 1  1  1   0   Trouble relaxing 0  1  0  0   Restless 0  1  0  0   Easily annoyed or irritable 0  0  0  0   Afraid - awful might happen 0  0  0  0   Total GAD 7 Score 2 5 6  0  Anxiety Difficulty Somewhat difficult Not difficult at all Not difficult at all Not difficult at all     Data saved with a previous flowsheet row definition       03/29/2024    4:15 PM 02/23/2024    9:11 AM 12/21/2023    9:16 AM  Depression screen PHQ 2/9  Decreased Interest 0 0 0  Down, Depressed, Hopeless 0 0 0  PHQ - 2 Score 0 0 0        New complaints: None today  Allergies  Allergen Reactions   Shellfish Allergy Hives, Nausea Only and Swelling   Outpatient Encounter Medications as of 06/25/2024  Medication Sig   acetaminophen  (TYLENOL ) 500 MG  tablet Take 1,000 mg by mouth daily as needed for headache (knee pain).   citalopram  (CELEXA ) 20 MG tablet Take 1 tablet (20 mg total) by mouth daily. Needs to be seen for further refills   doxycycline  (VIBRA -TABS) 100 MG tablet Take 1 tablet (100 mg total) by mouth 2 (two) times daily. 1 po bid   fluticasone  (FLONASE ) 50 MCG/ACT nasal spray Place 2 sprays into both nostrils daily. (Patient not taking: Reported on 03/29/2024)   lisinopril -hydrochlorothiazide  (ZESTORETIC ) 20-12.5 MG tablet TAKE 2 TABLETS BY MOUTH EVERY DAY   No facility-administered encounter medications on file as of 06/25/2024.    No past surgical history on file.  Family History  Problem Relation Age of Onset   Stroke Father    Hypertension Father    Hyperlipidemia Sister    Hypertension Sister    Cancer Sister 86       ovarian   Cancer Sister  Breast      Controlled substance contract: n/a     Review of Systems  Constitutional:  Negative for diaphoresis.  Eyes:  Negative for pain.  Respiratory:  Negative for shortness of breath.   Cardiovascular:  Negative for chest pain, palpitations and leg swelling.  Gastrointestinal:  Negative for abdominal pain.  Endocrine: Negative for polydipsia.  Skin:  Negative for rash.  Neurological:  Negative for dizziness, weakness and headaches.  Hematological:  Does not bruise/bleed easily.  All other systems reviewed and are negative.      Objective:   Physical Exam Vitals and nursing note reviewed.  Constitutional:      General: She is not in acute distress.    Appearance: Normal appearance. She is well-developed.  HENT:     Head: Normocephalic.     Right Ear: Tympanic membrane normal.     Left Ear: Tympanic membrane normal.     Nose: Nose normal.     Mouth/Throat:     Mouth: Mucous membranes are moist.  Eyes:     Pupils: Pupils are equal, round, and reactive to light.  Neck:     Vascular: No carotid bruit or JVD.  Cardiovascular:     Rate and  Rhythm: Normal rate and regular rhythm.     Pulses:          Dorsalis pedis pulses are 2+ on the right side and 2+ on the left side.     Heart sounds: Normal heart sounds.  Pulmonary:     Effort: Pulmonary effort is normal. No respiratory distress.     Breath sounds: Normal breath sounds. No wheezing or rales.  Chest:     Chest wall: No tenderness.  Abdominal:     General: Bowel sounds are normal. There is no distension or abdominal bruit.     Palpations: Abdomen is soft. There is no hepatomegaly, splenomegaly, mass or pulsatile mass.     Tenderness: There is no abdominal tenderness.  Musculoskeletal:        General: Normal range of motion.     Cervical back: Normal range of motion and neck supple.  Lymphadenopathy:     Cervical: No cervical adenopathy.  Skin:    General: Skin is warm and dry.  Neurological:     Mental Status: She is alert and oriented to person, place, and time.     Deep Tendon Reflexes: Reflexes are normal and symmetric.  Psychiatric:        Behavior: Behavior normal.        Thought Content: Thought content normal.        Judgment: Judgment normal.     There were no vitals taken for this visit.        Assessment & Plan:   [name] comes in today with chief complaint of No chief complaint on file.   Diagnosis and orders addressed:  1. Primary hypertension Low sodium diet - lisinopril -hydrochlorothiazide  (ZESTORETIC ) 20-12.5 MG tablet; Take 2 tablets by mouth daily.  Dispense: 180 tablet; Refill: 1 - CBC with Differential/Platelet - CMP14+EGFR  2. Mixed hyperlipidemia Low fat diet - Lipid panel  3. Anxiety Stress management Start on celexa  20mg  daily- side effects reviewed  Labs pending Health Maintenance reviewed Diet and exercise encouraged  Follow up plan: 6 months   Mary-Margaret Gladis, FNP  "

## 2024-07-09 ENCOUNTER — Other Ambulatory Visit: Payer: Self-pay | Admitting: Nurse Practitioner

## 2024-07-09 DIAGNOSIS — I10 Essential (primary) hypertension: Secondary | ICD-10-CM

## 2024-07-10 ENCOUNTER — Other Ambulatory Visit: Payer: Self-pay | Admitting: Nurse Practitioner

## 2024-07-11 ENCOUNTER — Other Ambulatory Visit: Payer: Self-pay | Admitting: Nurse Practitioner

## 2025-02-25 ENCOUNTER — Ambulatory Visit: Payer: Self-pay
